# Patient Record
Sex: Female | Born: 1955 | Race: Black or African American | Hispanic: No | Smoking: Never smoker
Health system: Southern US, Community
[De-identification: ages and names within clinical notes are randomized; demographics above are authoritative.]

## PROBLEM LIST (undated history)

## (undated) DIAGNOSIS — Z9889 Other specified postprocedural states: Secondary | ICD-10-CM

## (undated) DIAGNOSIS — F419 Anxiety disorder, unspecified: Secondary | ICD-10-CM

## (undated) DIAGNOSIS — K219 Gastro-esophageal reflux disease without esophagitis: Secondary | ICD-10-CM

## (undated) DIAGNOSIS — R112 Nausea with vomiting, unspecified: Secondary | ICD-10-CM

## (undated) DIAGNOSIS — I10 Essential (primary) hypertension: Secondary | ICD-10-CM

## (undated) DIAGNOSIS — M1990 Unspecified osteoarthritis, unspecified site: Secondary | ICD-10-CM

## (undated) HISTORY — PX: HAND SURGERY: SHX662

## (undated) HISTORY — PX: ABDOMINAL HYSTERECTOMY: SHX81

## (undated) HISTORY — PX: CARPAL TUNNEL RELEASE: SHX101

---

## 1998-02-25 ENCOUNTER — Encounter: Payer: Self-pay | Admitting: Emergency Medicine

## 1998-02-25 ENCOUNTER — Emergency Department (HOSPITAL_COMMUNITY): Admission: EM | Admit: 1998-02-25 | Discharge: 1998-02-25 | Payer: Self-pay | Admitting: Emergency Medicine

## 1998-05-14 ENCOUNTER — Encounter: Payer: Self-pay | Admitting: Emergency Medicine

## 1998-05-14 ENCOUNTER — Emergency Department (HOSPITAL_COMMUNITY): Admission: EM | Admit: 1998-05-14 | Discharge: 1998-05-14 | Payer: Self-pay | Admitting: *Deleted

## 1998-05-25 ENCOUNTER — Ambulatory Visit (HOSPITAL_COMMUNITY): Admission: RE | Admit: 1998-05-25 | Discharge: 1998-05-25 | Payer: Self-pay | Admitting: Family Medicine

## 1998-08-06 ENCOUNTER — Ambulatory Visit (HOSPITAL_COMMUNITY): Admission: RE | Admit: 1998-08-06 | Discharge: 1998-08-06 | Payer: Self-pay | Admitting: Family Medicine

## 1998-12-18 ENCOUNTER — Ambulatory Visit (HOSPITAL_BASED_OUTPATIENT_CLINIC_OR_DEPARTMENT_OTHER): Admission: RE | Admit: 1998-12-18 | Discharge: 1998-12-18 | Payer: Self-pay | Admitting: Orthopedic Surgery

## 1999-03-02 ENCOUNTER — Encounter: Admission: RE | Admit: 1999-03-02 | Discharge: 1999-05-31 | Payer: Self-pay | Admitting: Family Medicine

## 1999-04-14 ENCOUNTER — Ambulatory Visit (HOSPITAL_BASED_OUTPATIENT_CLINIC_OR_DEPARTMENT_OTHER): Admission: RE | Admit: 1999-04-14 | Discharge: 1999-04-14 | Payer: Self-pay | Admitting: Orthopedic Surgery

## 1999-11-03 ENCOUNTER — Encounter (INDEPENDENT_AMBULATORY_CARE_PROVIDER_SITE_OTHER): Payer: Self-pay | Admitting: *Deleted

## 1999-11-03 ENCOUNTER — Ambulatory Visit (HOSPITAL_BASED_OUTPATIENT_CLINIC_OR_DEPARTMENT_OTHER): Admission: RE | Admit: 1999-11-03 | Discharge: 1999-11-03 | Payer: Self-pay | Admitting: Orthopedic Surgery

## 2001-09-07 ENCOUNTER — Other Ambulatory Visit: Admission: RE | Admit: 2001-09-07 | Discharge: 2001-09-07 | Payer: Self-pay | Admitting: Family Medicine

## 2002-07-04 ENCOUNTER — Encounter: Payer: Self-pay | Admitting: Emergency Medicine

## 2002-07-04 ENCOUNTER — Emergency Department (HOSPITAL_COMMUNITY): Admission: EM | Admit: 2002-07-04 | Discharge: 2002-07-04 | Payer: Self-pay | Admitting: *Deleted

## 2003-02-16 ENCOUNTER — Encounter: Payer: Self-pay | Admitting: Emergency Medicine

## 2003-02-16 ENCOUNTER — Emergency Department (HOSPITAL_COMMUNITY): Admission: EM | Admit: 2003-02-16 | Discharge: 2003-02-16 | Payer: Self-pay | Admitting: Emergency Medicine

## 2003-08-04 ENCOUNTER — Emergency Department (HOSPITAL_COMMUNITY): Admission: EM | Admit: 2003-08-04 | Discharge: 2003-08-04 | Payer: Self-pay | Admitting: Emergency Medicine

## 2003-08-05 ENCOUNTER — Emergency Department (HOSPITAL_COMMUNITY): Admission: EM | Admit: 2003-08-05 | Discharge: 2003-08-05 | Payer: Self-pay | Admitting: Family Medicine

## 2004-06-24 ENCOUNTER — Emergency Department (HOSPITAL_COMMUNITY): Admission: EM | Admit: 2004-06-24 | Discharge: 2004-06-24 | Payer: Self-pay | Admitting: Family Medicine

## 2004-08-02 ENCOUNTER — Emergency Department (HOSPITAL_COMMUNITY): Admission: EM | Admit: 2004-08-02 | Discharge: 2004-08-02 | Payer: Self-pay | Admitting: Family Medicine

## 2005-05-09 ENCOUNTER — Emergency Department (HOSPITAL_COMMUNITY): Admission: EM | Admit: 2005-05-09 | Discharge: 2005-05-09 | Payer: Self-pay | Admitting: Family Medicine

## 2005-06-21 ENCOUNTER — Emergency Department (HOSPITAL_COMMUNITY): Admission: EM | Admit: 2005-06-21 | Discharge: 2005-06-21 | Payer: Self-pay | Admitting: Emergency Medicine

## 2005-06-24 ENCOUNTER — Emergency Department (HOSPITAL_COMMUNITY): Admission: EM | Admit: 2005-06-24 | Discharge: 2005-06-24 | Payer: Self-pay | Admitting: Family Medicine

## 2005-06-29 ENCOUNTER — Emergency Department (HOSPITAL_COMMUNITY): Admission: EM | Admit: 2005-06-29 | Discharge: 2005-06-29 | Payer: Self-pay | Admitting: Emergency Medicine

## 2005-07-01 ENCOUNTER — Emergency Department (HOSPITAL_COMMUNITY): Admission: EM | Admit: 2005-07-01 | Discharge: 2005-07-01 | Payer: Self-pay | Admitting: Emergency Medicine

## 2005-07-15 ENCOUNTER — Emergency Department (HOSPITAL_COMMUNITY): Admission: EM | Admit: 2005-07-15 | Discharge: 2005-07-15 | Payer: Self-pay | Admitting: Family Medicine

## 2005-11-20 ENCOUNTER — Emergency Department (HOSPITAL_COMMUNITY): Admission: EM | Admit: 2005-11-20 | Discharge: 2005-11-20 | Payer: Self-pay | Admitting: Emergency Medicine

## 2005-11-25 ENCOUNTER — Encounter: Admission: RE | Admit: 2005-11-25 | Discharge: 2005-11-25 | Payer: Self-pay | Admitting: Internal Medicine

## 2006-01-16 ENCOUNTER — Encounter: Admission: RE | Admit: 2006-01-16 | Discharge: 2006-01-16 | Payer: Self-pay | Admitting: Internal Medicine

## 2006-02-19 ENCOUNTER — Emergency Department (HOSPITAL_COMMUNITY): Admission: EM | Admit: 2006-02-19 | Discharge: 2006-02-19 | Payer: Self-pay | Admitting: Emergency Medicine

## 2006-03-12 ENCOUNTER — Emergency Department (HOSPITAL_COMMUNITY): Admission: EM | Admit: 2006-03-12 | Discharge: 2006-03-12 | Payer: Self-pay | Admitting: Emergency Medicine

## 2006-04-12 ENCOUNTER — Encounter: Admission: RE | Admit: 2006-04-12 | Discharge: 2006-04-12 | Payer: Self-pay | Admitting: Internal Medicine

## 2006-11-16 ENCOUNTER — Emergency Department (HOSPITAL_COMMUNITY): Admission: EM | Admit: 2006-11-16 | Discharge: 2006-11-16 | Payer: Self-pay | Admitting: Family Medicine

## 2006-11-28 ENCOUNTER — Encounter: Admission: RE | Admit: 2006-11-28 | Discharge: 2006-11-28 | Payer: Self-pay | Admitting: Internal Medicine

## 2007-01-18 ENCOUNTER — Emergency Department (HOSPITAL_COMMUNITY): Admission: EM | Admit: 2007-01-18 | Discharge: 2007-01-18 | Payer: Self-pay | Admitting: Emergency Medicine

## 2007-01-21 ENCOUNTER — Emergency Department (HOSPITAL_COMMUNITY): Admission: EM | Admit: 2007-01-21 | Discharge: 2007-01-21 | Payer: Self-pay | Admitting: Emergency Medicine

## 2007-04-09 ENCOUNTER — Emergency Department (HOSPITAL_COMMUNITY): Admission: EM | Admit: 2007-04-09 | Discharge: 2007-04-09 | Payer: Self-pay | Admitting: Emergency Medicine

## 2007-04-15 ENCOUNTER — Emergency Department (HOSPITAL_COMMUNITY): Admission: EM | Admit: 2007-04-15 | Discharge: 2007-04-15 | Payer: Self-pay | Admitting: Family Medicine

## 2007-05-20 ENCOUNTER — Emergency Department (HOSPITAL_COMMUNITY): Admission: EM | Admit: 2007-05-20 | Discharge: 2007-05-20 | Payer: Self-pay | Admitting: Emergency Medicine

## 2007-06-14 ENCOUNTER — Emergency Department (HOSPITAL_COMMUNITY): Admission: EM | Admit: 2007-06-14 | Discharge: 2007-06-14 | Payer: Self-pay | Admitting: Emergency Medicine

## 2007-07-16 ENCOUNTER — Emergency Department (HOSPITAL_COMMUNITY): Admission: EM | Admit: 2007-07-16 | Discharge: 2007-07-16 | Payer: Self-pay | Admitting: Family Medicine

## 2007-11-21 ENCOUNTER — Emergency Department (HOSPITAL_COMMUNITY): Admission: EM | Admit: 2007-11-21 | Discharge: 2007-11-21 | Payer: Self-pay | Admitting: Family Medicine

## 2007-11-30 ENCOUNTER — Encounter: Admission: RE | Admit: 2007-11-30 | Discharge: 2007-11-30 | Payer: Self-pay | Admitting: Internal Medicine

## 2008-06-18 ENCOUNTER — Emergency Department (HOSPITAL_COMMUNITY): Admission: EM | Admit: 2008-06-18 | Discharge: 2008-06-18 | Payer: Self-pay | Admitting: Family Medicine

## 2008-06-30 ENCOUNTER — Emergency Department (HOSPITAL_COMMUNITY): Admission: EM | Admit: 2008-06-30 | Discharge: 2008-06-30 | Payer: Self-pay | Admitting: Family Medicine

## 2008-07-07 ENCOUNTER — Encounter: Admission: RE | Admit: 2008-07-07 | Discharge: 2008-07-07 | Payer: Self-pay | Admitting: Internal Medicine

## 2008-12-02 ENCOUNTER — Encounter: Admission: RE | Admit: 2008-12-02 | Discharge: 2008-12-02 | Payer: Self-pay | Admitting: Internal Medicine

## 2009-02-20 ENCOUNTER — Emergency Department (HOSPITAL_COMMUNITY): Admission: EM | Admit: 2009-02-20 | Discharge: 2009-02-20 | Payer: Self-pay | Admitting: Emergency Medicine

## 2009-09-24 ENCOUNTER — Emergency Department (HOSPITAL_COMMUNITY): Admission: EM | Admit: 2009-09-24 | Discharge: 2009-09-24 | Payer: Self-pay | Admitting: Emergency Medicine

## 2009-12-07 ENCOUNTER — Encounter: Admission: RE | Admit: 2009-12-07 | Discharge: 2009-12-07 | Payer: Self-pay | Admitting: Internal Medicine

## 2010-02-11 ENCOUNTER — Emergency Department (HOSPITAL_COMMUNITY): Admission: EM | Admit: 2010-02-11 | Discharge: 2010-02-11 | Payer: Self-pay | Admitting: Emergency Medicine

## 2010-02-11 ENCOUNTER — Encounter (INDEPENDENT_AMBULATORY_CARE_PROVIDER_SITE_OTHER): Payer: Self-pay | Admitting: Emergency Medicine

## 2010-02-11 ENCOUNTER — Ambulatory Visit: Payer: Self-pay | Admitting: Surgery

## 2010-04-09 ENCOUNTER — Emergency Department (HOSPITAL_COMMUNITY): Admission: EM | Admit: 2010-04-09 | Discharge: 2010-04-09 | Payer: Self-pay | Admitting: Family Medicine

## 2010-07-10 ENCOUNTER — Emergency Department (HOSPITAL_COMMUNITY)
Admission: EM | Admit: 2010-07-10 | Discharge: 2010-07-10 | Payer: Self-pay | Source: Home / Self Care | Admitting: Family Medicine

## 2010-10-09 LAB — URINALYSIS, ROUTINE W REFLEX MICROSCOPIC
Glucose, UA: NEGATIVE mg/dL
Ketones, ur: NEGATIVE mg/dL
Nitrite: NEGATIVE
Protein, ur: NEGATIVE mg/dL
Specific Gravity, Urine: 1.007 (ref 1.005–1.030)
pH: 6 (ref 5.0–8.0)

## 2010-10-09 LAB — WET PREP, GENITAL

## 2010-10-09 LAB — URINE MICROSCOPIC-ADD ON

## 2010-10-09 LAB — GC/CHLAMYDIA PROBE AMP, GENITAL
Chlamydia, DNA Probe: NEGATIVE
GC Probe Amp, Genital: NEGATIVE

## 2010-11-01 ENCOUNTER — Other Ambulatory Visit: Payer: Self-pay | Admitting: Internal Medicine

## 2010-11-01 DIAGNOSIS — Z1231 Encounter for screening mammogram for malignant neoplasm of breast: Secondary | ICD-10-CM

## 2010-11-19 NOTE — Op Note (Signed)
[place]  Patient:    Sarah Webster, Sarah Webster                           MRN: [id number] Proc. Date: 11/03/99 Adm. Date:  [id number] Attending:  Marlowe Sh[place] CC:         Artist Pais Mina Marble, M.D., (2 copies) [address] [facility]                           Operative Report  PREOPERATIVE DIAGNOSIS:  Recurrent carpal tunnel syndrome, left hand and wrist.  POSTOPERATIVE DIAGNOSIS:  Recurrent carpal tunnel syndrome, left hand and wrist.  OPERATION:  Exploration medial nerve, left wrist, with neurolysis as well as hypothenar fat pad flap coverage of median nerve.  SURGEON:  Artist Pais. Mina Marble, M.D.  ASSISTANT:  Nicki Reaper, M.D.  ANESTHESIA:  General.  TOURNIQUET TIME:  1 hour.  COMPLICATIONS:  None.  DRAINS:  None.  DESCRIPTION OF PROCEDURE:  The patient was taken to the operating room, and after the induction of adequate general anesthesia, the left upper extremity was prepped and draped in the usual sterile fashion.  An Esmarch was used to exsanguinate the limb, and tourniquet was inflated to 250 mmHg.  At this point in time, using a previous incision for her carpal tunnel relief done in the past, a standard approach to the median nerve at the wrist was undertaken. The skin was incised, and the flaps were elevated in both the radial and ulnar direction.  Once this was done in the proximal most aspect of the incision, the median nerve was identified in normal tissue.  At this point in time, the median nerve was traced from proximal to distal through the previous operative site.  There was significant scarring noted throughout the entire length of the operative field from an area approximately 3 to 4 cm proximal to the wrist crease distal to the edge of the transverse carpal ligament.  Using a combination of #15 blade, tenotomy scissors, and blunt dissection, the median nerve was carefully dissected free from a significant amount of  underlying scar and cicatrix.  A neurolysis was performed under loupe magnification throughout the entire length of the exposed nerve.  The palmar cutaneous branch was carefully identified and retracted radially to protect it during this dissection.  After the nerve had been thoroughly isolated, decision was then made to proceed with a hypothenar fat pad flap.  Careful dissection of the hypothenar fat was undertaken starting distally in the area of Guyons canal and going proximally into the distal forearm area.  The flap was elevated carefully with #15 blade and tenotomy scissors with some attention paid to the confluence between the ulnar flap, the transverse carpal ligament, and the undersurface of the flap.  This was carefully dissected free of the ulnar neurovascular bundle.  Once this was done, using 3-0 Mersilene, the contents of the carpal canal was retracted ulnarly, and the 3-0 Mersilene stitch was placed deep in the carpal canal, and then the hypothenar fat pad flap was advanced over the contents of the carpal canal including the median nerve.  There was still some exposed median nerve proximally, and at this point in time, a fair amount of fat that had been harvested from the ulnar side of the distal forearm on a pedicle was then sutured over the median nerve to the radial side of the incision using 3-0  Vicryl.  The entire median was then covered throughout the length of the incision.  The wound was then thoroughly irrigated and closed closely with a 4-0 nylon stitch in interrupted horizontal mattress sutures.  A sterile dressing of Xeroform, 4x4 fluffs, and compressive wrap were applied as well as a volar splint.  The patient tolerated the procedure well and went to the recovery room in stable fashion. DD:  11/03/99 TD:  11/04/99 Job: [id number]

## 2010-12-09 ENCOUNTER — Ambulatory Visit
Admission: RE | Admit: 2010-12-09 | Discharge: 2010-12-09 | Disposition: A | Discharge status: Home / Self Care | Payer: 59 | Source: Ambulatory Visit | Attending: Internal Medicine | Admitting: Internal Medicine

## 2010-12-09 DIAGNOSIS — Z1231 Encounter for screening mammogram for malignant neoplasm of breast: Secondary | ICD-10-CM

## 2011-04-18 LAB — POCT URINALYSIS DIP (DEVICE)
Hgb urine dipstick: NEGATIVE
Operator id: 239701
Specific Gravity, Urine: 1.01
Urobilinogen, UA: 0.2

## 2011-09-19 ENCOUNTER — Encounter (HOSPITAL_COMMUNITY): Payer: Self-pay | Admitting: Emergency Medicine

## 2011-09-19 ENCOUNTER — Emergency Department (INDEPENDENT_AMBULATORY_CARE_PROVIDER_SITE_OTHER)
Admission: EM | Admit: 2011-09-19 | Discharge: 2011-09-19 | Disposition: A | Discharge status: Home / Self Care | Payer: 59 | Source: Home / Self Care

## 2011-09-19 DIAGNOSIS — M545 Low back pain, unspecified: Secondary | ICD-10-CM

## 2011-09-19 HISTORY — DX: Essential (primary) hypertension: I10

## 2011-09-19 HISTORY — DX: Unspecified osteoarthritis, unspecified site: M19.90

## 2011-09-19 MED ORDER — CYCLOBENZAPRINE HCL 10 MG PO TABS: 10.0000 mg | ORAL_TABLET | Freq: Three times a day (TID) | ORAL | Status: AC | PRN

## 2011-09-19 MED ORDER — OXYCODONE-ACETAMINOPHEN 5-325 MG PO TABS: 1.0000 | ORAL_TABLET | ORAL | Status: AC | PRN

## 2011-09-19 NOTE — Discharge Instructions (Signed)
Thank you for coming in today. Please use the flexeril and percocet as needed.  Follow up with your doctor if you do not improve.  Call your doctor or go to the emergency room if you have new weakness, numbness , worsening pain or problems pooping or peeing.   Back Pain, Adult Back pain is very common. The pain often gets better over time. The cause of back pain is usually not dangerous. Most people can learn to manage their back pain on their own.  HOME CARE   Stay active. Start with short walks on flat ground if you can. Try to walk farther each day.   Do not sit, drive, or stand in one place for more than 30 minutes. Do not stay in bed.   Do not avoid exercise or work. Activity can help your back heal faster.   Be careful when you bend or lift an object. Bend at your knees, keep the object close to you, and do not twist.   Sleep on a firm mattress. Lie on your side, and bend your knees. If you lie on your back, put a pillow under your knees.   Only take medicines as told by your doctor.   Put ice on the injured area.   Put ice in a plastic bag.   Place a towel between your skin and the bag.   Leave the ice on for 15 to 20 minutes, 3 to 4 times a day for the first 2 to 3 days. After that, you can switch between ice and heat packs.   Ask your doctor about back exercises or massage.   Avoid feeling anxious or stressed. Find good ways to deal with stress, such as exercise.  GET HELP RIGHT AWAY IF:   Your pain does not go away with rest or medicine.   Your pain does not go away in 1 week.   You have new problems.   You do not feel well.   The pain spreads into your legs.   You cannot control when you poop (bowel movement) or pee (urinate).   Your arms or legs feel weak or lose feeling (numbness).   You feel sick to your stomach (nauseous) or throw up (vomit).   You have belly (abdominal) pain.   You feel like you may pass out (faint).  MAKE SURE YOU:   Understand  these instructions.   Will watch your condition.   Will get help right away if you are not doing well or get worse.  Document Released: 12/07/2007 Document Revised: 06/09/2011 Document Reviewed: 11/08/2010 [facility] Patient Information 2012 [place], [place].

## 2011-09-19 NOTE — ED Notes (Signed)
Pt having back pain since last Friday and has had an "accident in 99". Pt states pain is in legs as well, but that is normal for her.

## 2011-09-19 NOTE — ED Provider Notes (Signed)
Sarah Webster is a 56 y.o. female who presents to Urgent Care today for back pain since Friday. Patient has chronic back pain and is managed by her primary care doctor Dr. Renae Gloss with 60 tablets of Vicodin a month.  Friday she noted worsening bilateral lumbar back pain without radiation. She's tried ibuprofen and hydrocodone which have not helped much. She denies any radiation radiculopathy new weakness numbness or bowel or bladder dysfunction. Otherwise she feels well   PMH reviewed. Significant for chronic lumbar back pain and diabetes ROS as above otherwise neg Medications reviewed. No current facility-administered medications for this encounter.   Current Outpatient Prescriptions  Medication Sig Dispense Refill  . ALPRAZolam (XANAX) 0.25 MG tablet Take 0.25 mg by mouth 2 (two) times daily.      Marland Kitchen aspirin 81 MG tablet Take 81 mg by mouth daily.      Marland Kitchen HYDROcodone-acetaminophen (NORCO) 5-325 MG per tablet Take 1 tablet by mouth 2 (two) times daily.      Marland Kitchen ibuprofen (ADVIL,MOTRIN) 800 MG tablet Take 800 mg by mouth every 8 (eight) hours as needed.      . metFORMIN (GLUCOPHAGE) 1000 MG tablet Take 1,000 mg by mouth 2 (two) times daily with a meal.      . cyclobenzaprine (FLEXERIL) 10 MG tablet Take 1 tablet (10 mg total) by mouth 3 (three) times daily as needed for muscle spasms.  30 tablet  0  . oxyCODONE-acetaminophen (ROXICET) 5-325 MG per tablet Take 1 tablet by mouth every 4 (four) hours as needed for pain.  15 tablet  0  (controlled substance database query performed which confirms patient's medication history)  Exam:  BP 126/68  Pulse 85  Temp(Src) 98.3 F (36.8 C) (Oral)  Resp 18  SpO2 100% Gen: Well NAD Lungs: CTABL Nl WOB Heart: RRR no MRG Abd: NABS, NT, ND Exts: Non edematous BL  LE, warm and well perfused.  MSK: Nontender over spinal midline. Bilateral SI joint pain.  Straight leg raise test and Pearlean Brownie tests are normal bilateral.  Strength is preserved throughout lower  extremities as is sensation. Reflexes are 1+ and equal bilaterally. Patient can get off of and onto exam table by herself.    Assessment and Plan: 56 year old woman with acute back pain on chronic back pain.  She has no red flag signs or symptoms today.  This is likely muscular back pain.  As her pain is resistant to Vicodin will use 15 tablets of Percocet and Flexeril. Discussed warning signs and asked patient to followup if not improving. Handout provided. Patient expresses understanding.     Rodolph Bong, MD 09/19/11 [phone]

## 2011-09-20 NOTE — ED Provider Notes (Signed)
Medical screening examination/treatment/procedure(s) were performed by a resident physician and as supervising physician I was immediately available for consultation/collaboration.  Leslee Home, M.D.   Reuben Likes, MD 09/20/11 [phone]

## 2011-10-23 ENCOUNTER — Emergency Department (HOSPITAL_COMMUNITY)
Admission: EM | Admit: 2011-10-23 | Discharge: 2011-10-23 | Disposition: A | Discharge status: Home / Self Care | Payer: Medicare Other | Attending: Emergency Medicine | Admitting: Emergency Medicine

## 2011-10-23 ENCOUNTER — Emergency Department (HOSPITAL_COMMUNITY): Payer: Medicare Other

## 2011-10-23 ENCOUNTER — Encounter (HOSPITAL_COMMUNITY): Payer: Self-pay | Admitting: Emergency Medicine

## 2011-10-23 DIAGNOSIS — Z79899 Other long term (current) drug therapy: Secondary | ICD-10-CM | POA: Insufficient documentation

## 2011-10-23 DIAGNOSIS — R070 Pain in throat: Secondary | ICD-10-CM | POA: Insufficient documentation

## 2011-10-23 DIAGNOSIS — J3489 Other specified disorders of nose and nasal sinuses: Secondary | ICD-10-CM | POA: Insufficient documentation

## 2011-10-23 DIAGNOSIS — R499 Unspecified voice and resonance disorder: Secondary | ICD-10-CM | POA: Insufficient documentation

## 2011-10-23 DIAGNOSIS — R05 Cough: Secondary | ICD-10-CM | POA: Insufficient documentation

## 2011-10-23 DIAGNOSIS — R220 Localized swelling, mass and lump, head: Secondary | ICD-10-CM | POA: Insufficient documentation

## 2011-10-23 DIAGNOSIS — I10 Essential (primary) hypertension: Secondary | ICD-10-CM | POA: Insufficient documentation

## 2011-10-23 DIAGNOSIS — M129 Arthropathy, unspecified: Secondary | ICD-10-CM | POA: Insufficient documentation

## 2011-10-23 DIAGNOSIS — Z9889 Other specified postprocedural states: Secondary | ICD-10-CM | POA: Insufficient documentation

## 2011-10-23 DIAGNOSIS — J329 Chronic sinusitis, unspecified: Secondary | ICD-10-CM

## 2011-10-23 DIAGNOSIS — R059 Cough, unspecified: Secondary | ICD-10-CM | POA: Insufficient documentation

## 2011-10-23 DIAGNOSIS — E119 Type 2 diabetes mellitus without complications: Secondary | ICD-10-CM | POA: Insufficient documentation

## 2011-10-23 MED ORDER — AZITHROMYCIN 250 MG PO TABS
500.0000 mg | ORAL_TABLET | Freq: Once | ORAL | Status: AC
  Administered 2011-10-23: 500 mg via ORAL
  Filled 2011-10-23: qty 2

## 2011-10-23 MED ORDER — OXYMETAZOLINE HCL 0.05 % NA SOLN
1.0000 | Freq: Once | NASAL | Status: AC
  Administered 2011-10-23: 1 via NASAL
  Filled 2011-10-23: qty 15

## 2011-10-23 MED ORDER — AZITHROMYCIN 250 MG PO TABS: ORAL_TABLET | ORAL | Status: AC

## 2011-10-23 NOTE — ED Notes (Signed)
Patient transported to X-ray 

## 2011-10-23 NOTE — ED Provider Notes (Signed)
History     CSN: [id number]  Arrival date & time 10/23/11  1648   First MD Initiated Contact with Patient 10/23/11 1728      Chief Complaint  Patient presents with  . Cough    (Consider location/radiation/quality/duration/timing/severity/associated sxs/prior treatment) HPI Comments: Nasal congestion and sinus pressure worsening now for 2 weeks.  Starting to cause a frontal headache.  Patient is a 56 y.o. female presenting with cough. The history is provided by the patient.  Cough This is a new problem. Episode onset: 2 weeks. The problem occurs every few minutes. The problem has not changed since onset.The cough is productive of sputum. There has been no fever. Associated symptoms include ear congestion, rhinorrhea and sore throat. Pertinent negatives include no chills, no sweats, no ear pain, no shortness of breath and no wheezing. She has tried decongestants for the symptoms. The treatment provided no relief. She is not a smoker.    Past Medical History  Diagnosis Date  . Arthritis   . Diabetes mellitus   . Hypertension     Past Surgical History  Procedure Date  . Hand surgery     No family history on file.  History  Substance Use Topics  . Smoking status: Not on file  . Smokeless tobacco: Not on file  . Alcohol Use: No    OB History    Grav Para Term Preterm Abortions TAB SAB Ect Mult Living                  Review of Systems  Constitutional: Negative for chills.  HENT: Positive for nosebleeds, congestion, sore throat, rhinorrhea, voice change and sinus pressure. Negative for ear pain, sneezing, trouble swallowing, neck pain and ear discharge.   Respiratory: Positive for cough. Negative for shortness of breath and wheezing.   All other systems reviewed and are negative.    Allergies  Levaquin; Ciprofloxacin; Contrast media; Ponstel; and Sulfa antibiotics  Home Medications   Current Outpatient Rx  Name Route Sig Dispense Refill  . ALPRAZOLAM 0.25 MG  PO TABS Oral Take 0.25 mg by mouth 2 (two) times daily.    . ASPIRIN 81 MG PO TABS Oral Take 81 mg by mouth daily.    Marland Kitchen EZETIMIBE-SIMVASTATIN 10-20 MG PO TABS Oral Take 1 tablet by mouth every morning.    Marland Kitchen HYDROCODONE-ACETAMINOPHEN 5-325 MG PO TABS Oral Take 1 tablet by mouth every 6 (six) hours as needed. For pain    . IBUPROFEN 800 MG PO TABS Oral Take 800 mg by mouth every 8 (eight) hours as needed. For pain    . METFORMIN HCL 1000 MG PO TABS Oral Take 1,000 mg by mouth 2 (two) times daily with a meal.    . SITAGLIPTIN PHOSPHATE 100 MG PO TABS Oral Take 100 mg by mouth daily.    Marland Kitchen VALSARTAN 160 MG PO TABS Oral Take 160 mg by mouth daily.      BP 112/96  Pulse 108  Temp(Src) 98.4 F (36.9 C) (Oral)  Resp 20  SpO2 99%  Physical Exam  Nursing note and vitals reviewed. Constitutional: She is oriented to person, place, and time. She appears well-developed and well-nourished. No distress.  HENT:  Head: Normocephalic and atraumatic.  Right Ear: Tympanic membrane and ear canal normal.  Left Ear: Tympanic membrane and ear canal normal.  Nose: Mucosal edema and rhinorrhea present. No epistaxis. Right sinus exhibits maxillary sinus tenderness and frontal sinus tenderness. Left sinus exhibits maxillary sinus tenderness and frontal sinus  tenderness.  Mouth/Throat: Mucous membranes are normal. Posterior oropharyngeal erythema present. No posterior oropharyngeal edema or tonsillar abscesses.  Eyes: EOM are normal. Pupils are equal, round, and reactive to light.  Cardiovascular: Normal rate, regular rhythm, normal heart sounds and intact distal pulses.  Exam reveals no friction rub.   No murmur heard. Pulmonary/Chest: Effort normal and breath sounds normal. She has no wheezes. She has no rales.  Musculoskeletal: Normal range of motion. She exhibits no tenderness.       No edema  Neurological: She is alert and oriented to person, place, and time. No cranial nerve deficit.  Skin: Skin is warm and  dry. No rash noted.  Psychiatric: She has a normal mood and affect. Her behavior is normal.    ED Course  Procedures (including critical care time)  Labs Reviewed - No data to display Dg Chest 2 View  10/23/2011  *RADIOLOGY REPORT*  Clinical Data: Cough  CHEST - 2 VIEW  Comparison: 04/12/2006  Findings: Cardiomediastinal silhouette is within normal limits. The lungs are clear. No pleural effusion.  No pneumothorax.  No acute osseous abnormality.  IMPRESSION: Normal chest.  Original Report Authenticated By: Harrel Lemon, M.D.     No diagnosis found.    MDM   Pt with symptoms consistent with sinusitis with symptoms for 2 weeks.  Well appearing here.  No signs of breathing difficulty  No signs of pharyngitis, otitis or abnormal abdominal findings.   CXR wnl.  Pt treated with z-pack and afrin.         Gwyneth Sprout, MD 10/23/11 1743

## 2011-10-23 NOTE — ED Notes (Signed)
C/o productive cough with green sputum, congestion, and headache x 2 weeks.  States she saw MD and was told to take Claritin.  States she is unable to take it because she has reactions to a lot of medicine.

## 2011-10-23 NOTE — Discharge Instructions (Signed)

## 2011-10-31 ENCOUNTER — Other Ambulatory Visit: Payer: Self-pay | Admitting: Internal Medicine

## 2011-10-31 DIAGNOSIS — Z1231 Encounter for screening mammogram for malignant neoplasm of breast: Secondary | ICD-10-CM

## 2011-11-04 ENCOUNTER — Encounter (HOSPITAL_COMMUNITY): Payer: Self-pay | Admitting: *Deleted

## 2011-11-04 ENCOUNTER — Emergency Department (INDEPENDENT_AMBULATORY_CARE_PROVIDER_SITE_OTHER)
Admission: EM | Admit: 2011-11-04 | Discharge: 2011-11-04 | Disposition: A | Discharge status: Home / Self Care | Payer: Medicare Other | Source: Home / Self Care | Attending: Emergency Medicine | Admitting: Emergency Medicine

## 2011-11-04 DIAGNOSIS — R05 Cough: Secondary | ICD-10-CM

## 2011-11-04 DIAGNOSIS — N760 Acute vaginitis: Secondary | ICD-10-CM

## 2011-11-04 DIAGNOSIS — R059 Cough, unspecified: Secondary | ICD-10-CM

## 2011-11-04 LAB — POCT URINALYSIS DIP (DEVICE)
Nitrite: NEGATIVE
Protein, ur: NEGATIVE mg/dL
Specific Gravity, Urine: 1.01 (ref 1.005–1.030)
Urobilinogen, UA: 0.2 mg/dL (ref 0.0–1.0)
pH: 5.5 (ref 5.0–8.0)

## 2011-11-04 LAB — WET PREP, GENITAL

## 2011-11-04 MED ORDER — FLUCONAZOLE 150 MG PO TABS: 150.0000 mg | ORAL_TABLET | Freq: Once | ORAL | Status: AC

## 2011-11-04 MED ORDER — METRONIDAZOLE 500 MG PO TABS: 500.0000 mg | ORAL_TABLET | Freq: Two times a day (BID) | ORAL | Status: AC

## 2011-11-04 MED ORDER — PREDNISONE 20 MG PO TABS: 40.0000 mg | ORAL_TABLET | Freq: Every day | ORAL | Status: AC

## 2011-11-04 NOTE — ED Provider Notes (Signed)
History     CSN: [id number]  Arrival date & time 11/04/11  7829   First MD Initiated Contact with Patient 11/04/11 1012      Chief Complaint  Patient presents with  . Vaginitis  . Cough    (Consider location/radiation/quality/duration/timing/severity/associated sxs/prior treatment) HPI Comments: Patient presents to urgent care complaining of ongoing vaginal discharge with marked itchiness on her external genitalia that has been occurring for approximately a month. Patient denies any urinary symptoms, pelvic pain, vaginal bleeding, fevers, dysuria or flank pain.    Patient also described as a couple weeks ago she was seen by her doctor was prescribed antibiotics for respiratory infection she didn't recall the name of it. She was having a productive cough and upper congestion symptoms. Today she describes that she has this ongoing dry cough that it still lingering. Patient denies any fevers, shortness of breath, or any constitutional symptoms such as malaise, fevers, weight loss, fatigue  Patient is a 56 y.o. female presenting with cough and vaginal discharge. The history is provided by the patient.  Cough This is a new problem. The current episode started more than 1 week ago. The problem occurs constantly. The cough is non-productive. There has been no fever. Pertinent negatives include no chest pain and no chills. She has tried nothing for the symptoms. Her past medical history does not include pneumonia.  Vaginal Discharge This is a new problem. Pertinent negatives include no chest pain. The symptoms are aggravated by nothing. The symptoms are relieved by nothing. She has tried nothing for the symptoms.    Past Medical History  Diagnosis Date  . Arthritis   . Diabetes mellitus   . Hypertension     Past Surgical History  Procedure Date  . Hand surgery     History reviewed. No pertinent family history.  History  Substance Use Topics  . Smoking status: Not on file  .  Smokeless tobacco: Not on file  . Alcohol Use: No    OB History    Grav Para Term Preterm Abortions TAB SAB Ect Mult Living                  Review of Systems  Constitutional: Negative for fever, chills, diaphoresis, activity change, appetite change and fatigue.  Eyes: Negative for discharge.  Respiratory: Positive for cough. Negative for choking and chest tightness.   Cardiovascular: Negative for chest pain.  Genitourinary: Positive for vaginal discharge.  Neurological: Negative for dizziness.    Allergies  Levofloxacin; Ciprofloxacin; Contrast media; Ponstel; and Sulfa antibiotics  Home Medications   Current Outpatient Rx  Name Route Sig Dispense Refill  . ALPRAZOLAM 0.25 MG PO TABS Oral Take 0.25 mg by mouth 2 (two) times daily.    . ASPIRIN 81 MG PO TABS Oral Take 81 mg by mouth daily.    Marland Kitchen EZETIMIBE-SIMVASTATIN 10-20 MG PO TABS Oral Take 1 tablet by mouth every morning.    Marland Kitchen FLUCONAZOLE 150 MG PO TABS Oral Take 1 tablet (150 mg total) by mouth once. repeta treatment in 7 days 2 tablet 0  . HYDROCODONE-ACETAMINOPHEN 5-325 MG PO TABS Oral Take 1 tablet by mouth every 6 (six) hours as needed. For pain    . IBUPROFEN 800 MG PO TABS Oral Take 800 mg by mouth every 8 (eight) hours as needed. For pain    . METFORMIN HCL 1000 MG PO TABS Oral Take 1,000 mg by mouth 2 (two) times daily with a meal.    . METRONIDAZOLE  500 MG PO TABS Oral Take 1 tablet (500 mg total) by mouth 2 (two) times daily. 14 tablet 0  . PREDNISONE 20 MG PO TABS Oral Take 2 tablets (40 mg total) by mouth daily. 2 tablets daily for 5 days 10 tablet 0  . SITAGLIPTIN PHOSPHATE 100 MG PO TABS Oral Take 100 mg by mouth daily.    Marland Kitchen VALSARTAN 160 MG PO TABS Oral Take 160 mg by mouth daily.      BP 126/78  Pulse 82  Temp(Src) 98.2 F (36.8 C) (Oral)  Resp 18  SpO2 100%  Physical Exam  Nursing note and vitals reviewed. Constitutional: She appears well-developed and well-nourished.  Non-toxic appearance. She  does not have a sickly appearance. She does not appear ill. No distress.  HENT:  Head: Normocephalic.  Pulmonary/Chest: Breath sounds normal.  Abdominal: Soft.  Genitourinary: There is erythema around the vagina. No tenderness or bleeding around the vagina. No signs of injury around the vagina. Vaginal discharge found.  Musculoskeletal: Normal range of motion.  Neurological: She is alert. No cranial nerve deficit.  Skin: No rash noted.    ED Course  Procedures (including critical care time)  Labs Reviewed  POCT URINALYSIS DIP (DEVICE) - Abnormal; Notable for the following:    Glucose, UA 500 (*)    Ketones, ur TRACE (*)    Hgb urine dipstick TRACE (*)    All other components within normal limits  GC/CHLAMYDIA PROBE AMP, GENITAL  WET PREP, GENITAL   No results found.   1. Vaginitis and vulvovaginitis   2. Cough       MDM  Patient with 2 distinctive complaints. #1 vaginal discharge with moderate vaginal pruritus. #2 residual dry cough from recently treated respiratory infection. Patient had a pelvic exam was significant for discharge and erythema of external genitalia and #2 patient has a normal respiratory exam no respiratory distress or dyspnea pressure to take prednisone for 5 days and to followup with primary care Dr. cough or still persists beyond 4-6 weeks as an x-ray be helpful in ruling out other alternative sources for cough. Including lung cancer. (Although unlikely)        Jimmie Molly, MD 11/04/11 1141

## 2011-11-04 NOTE — ED Notes (Signed)
Pt is here with complaints vaginal itching x 1 month.  Pt is also complaining of a dry cough.

## 2011-11-04 NOTE — Discharge Instructions (Signed)
We will contact you if further treatment is needed once we obtain the culture results back. Should also followup with her doctor if her cough persists anywhere from 4-6 weeks, further evaluation should happen.

## 2011-11-05 LAB — GC/CHLAMYDIA PROBE AMP, GENITAL: Chlamydia, DNA Probe: NEGATIVE

## 2011-12-12 ENCOUNTER — Ambulatory Visit
Admission: RE | Admit: 2011-12-12 | Discharge: 2011-12-12 | Disposition: A | Discharge status: Home / Self Care | Payer: Medicare Other | Source: Ambulatory Visit | Attending: Internal Medicine | Admitting: Internal Medicine

## 2011-12-12 DIAGNOSIS — Z1231 Encounter for screening mammogram for malignant neoplasm of breast: Secondary | ICD-10-CM

## 2012-02-12 ENCOUNTER — Encounter (HOSPITAL_COMMUNITY): Payer: Self-pay | Admitting: *Deleted

## 2012-02-12 ENCOUNTER — Emergency Department (HOSPITAL_COMMUNITY)
Admission: EM | Admit: 2012-02-12 | Discharge: 2012-02-12 | Disposition: A | Discharge status: Home / Self Care | Payer: Medicare Other | Attending: Emergency Medicine | Admitting: Emergency Medicine

## 2012-02-12 DIAGNOSIS — Z882 Allergy status to sulfonamides status: Secondary | ICD-10-CM | POA: Insufficient documentation

## 2012-02-12 DIAGNOSIS — I10 Essential (primary) hypertension: Secondary | ICD-10-CM | POA: Insufficient documentation

## 2012-02-12 DIAGNOSIS — M129 Arthropathy, unspecified: Secondary | ICD-10-CM | POA: Insufficient documentation

## 2012-02-12 DIAGNOSIS — R51 Headache: Secondary | ICD-10-CM

## 2012-02-12 DIAGNOSIS — E119 Type 2 diabetes mellitus without complications: Secondary | ICD-10-CM | POA: Insufficient documentation

## 2012-02-12 LAB — POCT I-STAT, CHEM 8
Calcium, Ion: 1.23 mmol/L (ref 1.12–1.23)
Chloride: 100 mEq/L (ref 96–112)
HCT: 38 % (ref 36.0–46.0)
Potassium: 3.5 mEq/L (ref 3.5–5.1)
Sodium: 140 mEq/L (ref 135–145)

## 2012-02-12 NOTE — ED Notes (Signed)
Patient currently sitting in waiting room; no respiratory or acute distress.  Updated patient on wait time; apologized about delay.  Patient verbalizes understanding; calm and cooperative; denies any changes in condition.

## 2012-02-12 NOTE — ED Notes (Signed)
Pt ambulated to room with no assistance. Pt sitting up in bed watching tv and talking to grandson. Pt reports she started a new medication (Glimepeirride) on Aug. 1st. Pt reports she has been having a frontal HA X 3 days. Denies sensitivity to light and sounds.

## 2012-02-12 NOTE — ED Provider Notes (Signed)
History     CSN: [id number]  Arrival date & time 02/12/12  1654   First MD Initiated Contact with Patient 02/12/12 2016      Chief Complaint  Patient presents with  . Headache    (Consider location/radiation/quality/duration/timing/severity/associated sxs/prior treatment) Patient is a 56 y.o. female presenting with headaches. The history is provided by the patient.  Headache  This is a new problem. The current episode started more than 2 days ago (gradual onset). The problem occurs constantly. The problem has not changed since onset.The headache is associated with nothing. The pain is located in the frontal region. The quality of the pain is described as throbbing. The pain is at a severity of 5/10. The pain is moderate. The pain does not radiate. Pertinent negatives include no fever, no shortness of breath, no nausea and no vomiting. She has tried nothing for the symptoms.    Past Medical History  Diagnosis Date  . Arthritis   . Diabetes mellitus   . Hypertension     Past Surgical History  Procedure Date  . Hand surgery     No family history on file.  History  Substance Use Topics  . Smoking status: Never Smoker   . Smokeless tobacco: Not on file  . Alcohol Use: No    OB History    Grav Para Term Preterm Abortions TAB SAB Ect Mult Living                  Review of Systems  Constitutional: Negative for fever and chills.  HENT: Negative for neck pain.   Eyes: Negative.  Negative for pain and visual disturbance.  Respiratory: Negative for shortness of breath.   Cardiovascular: Negative for chest pain and leg swelling.  Gastrointestinal: Negative for nausea, vomiting and abdominal pain.  Genitourinary: Negative.   Musculoskeletal: Negative.   Skin: Negative.   Neurological: Positive for headaches. Negative for dizziness, speech difficulty, weakness, light-headedness and numbness.  Hematological: Negative.   Psychiatric/Behavioral: Negative.   All other systems  reviewed and are negative.    Allergies  Levofloxacin; Ciprofloxacin; Contrast media; Ponstel; and Sulfa antibiotics  Home Medications   Current Outpatient Rx  Name Route Sig Dispense Refill  . ALPRAZOLAM 0.25 MG PO TABS Oral Take 0.25 mg by mouth 2 (two) times daily.    . ASPIRIN 81 MG PO TABS Oral Take 81 mg by mouth daily.    Marland Kitchen EZETIMIBE-SIMVASTATIN 10-20 MG PO TABS Oral Take 1 tablet by mouth every morning.    Marland Kitchen GLIMEPIRIDE 2 MG PO TABS Oral Take 2 mg by mouth daily before breakfast.    . HYDROCODONE-ACETAMINOPHEN 5-325 MG PO TABS Oral Take 1 tablet by mouth every 6 (six) hours as needed. For pain    . METFORMIN HCL 1000 MG PO TABS Oral Take 1,000 mg by mouth 2 (two) times daily with a meal.    . SITAGLIPTIN PHOSPHATE 100 MG PO TABS Oral Take 100 mg by mouth daily.    Marland Kitchen VALSARTAN-HYDROCHLOROTHIAZIDE 160-12.5 MG PO TABS Oral Take 1 tablet by mouth daily.      BP 144/80  Pulse 80  Temp 99 F (37.2 C) (Oral)  Resp 18  SpO2 100%  Physical Exam  Nursing note and vitals reviewed. Constitutional: She is oriented to person, place, and time. She appears well-developed and well-nourished. No distress.  HENT:  Head: Normocephalic and atraumatic.  Right Ear: External ear normal.  Left Ear: External ear normal.  Nose: Nose normal.  Mouth/Throat: Oropharynx  is clear and moist.  Eyes: EOM are normal. Pupils are equal, round, and reactive to light.  Neck: Neck supple.  Cardiovascular: Normal rate, regular rhythm, normal heart sounds and intact distal pulses.   Pulmonary/Chest: Effort normal and breath sounds normal. No respiratory distress. She has no wheezes. She has no rales.  Abdominal: Soft. She exhibits no distension. There is no tenderness.  Musculoskeletal: She exhibits no edema.  Lymphadenopathy:    She has no cervical adenopathy.  Neurological: She is alert and oriented to person, place, and time. She has normal strength. No cranial nerve deficit or sensory deficit. She  exhibits normal muscle tone. She displays a negative Romberg sign. Coordination and gait normal. GCS eye subscore is 4. GCS verbal subscore is 5. GCS motor subscore is 6.  Skin: Skin is warm and dry. She is not diaphoretic. No pallor.    ED Course  Procedures (including critical care time)  Labs Reviewed  POCT I-STAT, CHEM 8 - Abnormal; Notable for the following:    Glucose, Bld 161 (*)     All other components within normal limits   No results found.   1. Headache       MDM  56 yo female with 3 days of gradual frontal headache. Neuro exam, including gait, normal. BP 140s. Started shortly after taking amaryl, which is similar to past attempt at using amaryl. Doubt bleed or infection with no neck pain, no acute onset and well appearing patient at this time. Will recommending stopping her amaryl and following up with PCP.         Pricilla Loveless, MD 02/12/12 [phone]

## 2012-02-12 NOTE — ED Notes (Signed)
PT states symptoms started after starting Amaryl for  diabetes

## 2012-02-12 NOTE — ED Notes (Signed)
Pt is here with headache to top of head for 3 days.  No vomiting. No vision changes.  Pt states she felt shakey

## 2012-02-13 NOTE — ED Provider Notes (Signed)
I saw and evaluated the patient, reviewed the resident's note and I agree with the findings and plan.  Gradual onset headache that is frontal. No nausea, vomiting, visual change.  Patient thinks related to glimeride.  No focal deficits.   Glynn Octave, MD 02/13/12 [phone]

## 2012-06-18 ENCOUNTER — Encounter (HOSPITAL_COMMUNITY): Payer: Self-pay | Admitting: *Deleted

## 2012-06-18 ENCOUNTER — Emergency Department (HOSPITAL_COMMUNITY)
Admission: EM | Admit: 2012-06-18 | Discharge: 2012-06-18 | Disposition: A | Discharge status: Home / Self Care | Payer: Medicare Other | Attending: Emergency Medicine | Admitting: Emergency Medicine

## 2012-06-18 DIAGNOSIS — Z7982 Long term (current) use of aspirin: Secondary | ICD-10-CM | POA: Insufficient documentation

## 2012-06-18 DIAGNOSIS — I10 Essential (primary) hypertension: Secondary | ICD-10-CM | POA: Insufficient documentation

## 2012-06-18 DIAGNOSIS — M65849 Other synovitis and tenosynovitis, unspecified hand: Secondary | ICD-10-CM | POA: Insufficient documentation

## 2012-06-18 DIAGNOSIS — E119 Type 2 diabetes mellitus without complications: Secondary | ICD-10-CM | POA: Insufficient documentation

## 2012-06-18 DIAGNOSIS — M129 Arthropathy, unspecified: Secondary | ICD-10-CM | POA: Insufficient documentation

## 2012-06-18 DIAGNOSIS — M65839 Other synovitis and tenosynovitis, unspecified forearm: Secondary | ICD-10-CM | POA: Insufficient documentation

## 2012-06-18 DIAGNOSIS — M778 Other enthesopathies, not elsewhere classified: Secondary | ICD-10-CM

## 2012-06-18 DIAGNOSIS — Z79899 Other long term (current) drug therapy: Secondary | ICD-10-CM | POA: Insufficient documentation

## 2012-06-18 DIAGNOSIS — Z791 Long term (current) use of non-steroidal anti-inflammatories (NSAID): Secondary | ICD-10-CM | POA: Insufficient documentation

## 2012-06-18 MED ORDER — HYDROCODONE-ACETAMINOPHEN 5-325 MG PO TABS: 1.0000 | ORAL_TABLET | Freq: Four times a day (QID) | ORAL | Status: DC | PRN

## 2012-06-18 MED ORDER — HYDROCODONE-ACETAMINOPHEN 5-325 MG PO TABS
2.0000 | ORAL_TABLET | Freq: Once | ORAL | Status: AC
  Administered 2012-06-18: 2 via ORAL
  Filled 2012-06-18: qty 2

## 2012-06-18 NOTE — ED Provider Notes (Signed)
History   This chart was scribed for Doug Sou, MD by Charolett Bumpers, ED Scribe. The patient was seen in room TR08C/TR08C. Patient's care was started at 1314.   CSN: [id number]  Arrival date & time 06/18/12  1138   First MD Initiated Contact with Patient 06/18/12 1314      Chief Complaint  Patient presents with  . Hand Pain    The history is provided by the patient. No language interpreter was used.   Sarah Webster is a 56 y.o. female who presents to the Emergency Department complaining of constant, moderate right hand pain that goes into her wrist that started yesterday around 4 pm after doing laundry. She states she took Ibuprofen with some relief. She denies any known recent injuries. She has a h/o cardiac hx and DM. She has a h/o carpal tunnel surgery on both wrists. She denies any tobacco or alcohol use. She is right handed. Blood sugar was 120 this morning per patient. No fever no injury pain worse with movement improved with remaining still  Past Medical History  Diagnosis Date  . Arthritis   . Diabetes mellitus   . Hypertension     Past Surgical History  Procedure Date  . Hand surgery     No family history on file.  History  Substance Use Topics  . Smoking status: Never Smoker   . Smokeless tobacco: Not on file  . Alcohol Use: No    OB History    Grav Para Term Preterm Abortions TAB SAB Ect Mult Living                  Review of Systems  Constitutional: Negative.   HENT: Negative.   Respiratory: Negative.   Cardiovascular: Negative.   Gastrointestinal: Negative.   Musculoskeletal: Positive for arthralgias.       Right hand and wrist pain.   Skin: Negative.   Neurological: Negative.   Hematological: Negative.   Psychiatric/Behavioral: Negative.     Allergies  Levofloxacin; Ciprofloxacin; Contrast media; Ponstel; and Sulfa antibiotics  Home Medications   Current Outpatient Rx  Name  Route  Sig  Dispense  Refill  . ALPRAZOLAM 0.25 MG  PO TABS   Oral   Take 0.25 mg by mouth 2 (two) times daily.         . ASPIRIN 81 MG PO TABS   Oral   Take 81 mg by mouth daily.         Marland Kitchen EZETIMIBE-SIMVASTATIN 10-20 MG PO TABS   Oral   Take 1 tablet by mouth every morning.         Marland Kitchen GLIMEPIRIDE 2 MG PO TABS   Oral   Take 2 mg by mouth daily before breakfast.         . METFORMIN HCL 1000 MG PO TABS   Oral   Take 1,000 mg by mouth 2 (two) times daily with a meal.         . SITAGLIPTIN PHOSPHATE 100 MG PO TABS   Oral   Take 100 mg by mouth daily.         Marland Kitchen VALSARTAN-HYDROCHLOROTHIAZIDE 160-12.5 MG PO TABS   Oral   Take 1 tablet by mouth daily.         Marland Kitchen HYDROCODONE-ACETAMINOPHEN 5-325 MG PO TABS   Oral   Take 1 tablet by mouth every 6 (six) hours as needed. For pain           BP 112/72  Pulse 68  Temp 98.1 F (36.7 C) (Oral)  Resp 16  SpO2 96%  Physical Exam  Nursing note and vitals reviewed. Constitutional: She appears well-developed and well-nourished.  HENT:  Head: Normocephalic and atraumatic.  Eyes: EOM are normal.  Neck: Neck supple.  Cardiovascular: Normal rate.   Pulmonary/Chest: Effort normal.  Abdominal:       Obese  Musculoskeletal: Normal range of motion. She exhibits tenderness. She exhibits no edema.       Right upper extremity : No redness no warmth, minimal swelling at the dorsum of the wrist Pain with dorsiflexion of right wrist. Pulse 2+ good capillary refill all other extremities without redness swelling or tenderness neurovascularly intact  Neurological: She is alert. Coordination normal.  Skin: Skin is warm and dry. No rash noted.  Psychiatric: She has a normal mood and affect.    ED Course  Procedures (including critical care time)  DIAGNOSTIC STUDIES: Oxygen Saturation is 96% on room air, adequate by my interpretation.    COORDINATION OF CARE:  14:20-Discussed planned course of treatment with the patient including pain medication and placing the wrist in a splint,  who is agreeable at this time. Pt is not driving.     Labs Reviewed - No data to display No results found.   No diagnosis found. 2:20 PM feels much improved after treatment with Vicodin and Velcro splint   MDM  Suspect tendinitis Plan prescription Norco Followup Dr. Renae Gloss if having significant pain one week     Diagnosis right wrist pain  I personally performed the services described in this documentation, which was scribed in my presence. The recorded information has been reviewed and is accurate.    Doug Sou, MD 06/18/12 1426

## 2012-06-18 NOTE — Progress Notes (Signed)
Orthopedic Tech Progress Note Patient Details:  Sarah Webster 19-Aug-1955 [id number] Velcro wrist splint applied to Right wrist Ortho Devices Type of Ortho Device: Velcro wrist splint Ortho Device/Splint Location: Right Ortho Device/Splint Interventions: Application   Asia R Thompson 06/18/2012, 1:34 PM

## 2012-06-18 NOTE — ED Notes (Signed)
Pt is here with right wrist and hand pain that hurts/swollen without injury

## 2012-08-05 ENCOUNTER — Emergency Department (HOSPITAL_COMMUNITY)
Admission: EM | Admit: 2012-08-05 | Discharge: 2012-08-05 | Disposition: A | Discharge status: Home / Self Care | Payer: Medicare Other | Attending: Emergency Medicine | Admitting: Emergency Medicine

## 2012-08-05 ENCOUNTER — Encounter (HOSPITAL_COMMUNITY): Payer: Self-pay | Admitting: Emergency Medicine

## 2012-08-05 DIAGNOSIS — S1093XA Contusion of unspecified part of neck, initial encounter: Secondary | ICD-10-CM | POA: Insufficient documentation

## 2012-08-05 DIAGNOSIS — S0031XA Abrasion of nose, initial encounter: Secondary | ICD-10-CM

## 2012-08-05 DIAGNOSIS — Y929 Unspecified place or not applicable: Secondary | ICD-10-CM | POA: Insufficient documentation

## 2012-08-05 DIAGNOSIS — Z7982 Long term (current) use of aspirin: Secondary | ICD-10-CM | POA: Insufficient documentation

## 2012-08-05 DIAGNOSIS — S0033XA Contusion of nose, initial encounter: Secondary | ICD-10-CM

## 2012-08-05 DIAGNOSIS — I10 Essential (primary) hypertension: Secondary | ICD-10-CM | POA: Insufficient documentation

## 2012-08-05 DIAGNOSIS — IMO0002 Reserved for concepts with insufficient information to code with codable children: Secondary | ICD-10-CM | POA: Insufficient documentation

## 2012-08-05 DIAGNOSIS — S0003XA Contusion of scalp, initial encounter: Secondary | ICD-10-CM | POA: Insufficient documentation

## 2012-08-05 DIAGNOSIS — Z23 Encounter for immunization: Secondary | ICD-10-CM | POA: Insufficient documentation

## 2012-08-05 DIAGNOSIS — Z8739 Personal history of other diseases of the musculoskeletal system and connective tissue: Secondary | ICD-10-CM | POA: Insufficient documentation

## 2012-08-05 DIAGNOSIS — E119 Type 2 diabetes mellitus without complications: Secondary | ICD-10-CM | POA: Insufficient documentation

## 2012-08-05 DIAGNOSIS — Y9389 Activity, other specified: Secondary | ICD-10-CM | POA: Insufficient documentation

## 2012-08-05 DIAGNOSIS — Z79899 Other long term (current) drug therapy: Secondary | ICD-10-CM | POA: Insufficient documentation

## 2012-08-05 MED ORDER — HYDROCODONE-ACETAMINOPHEN 5-325 MG PO TABS
1.0000 | ORAL_TABLET | Freq: Once | ORAL | Status: AC
  Administered 2012-08-05: 1 via ORAL
  Filled 2012-08-05: qty 1

## 2012-08-05 MED ORDER — HYDROCODONE-ACETAMINOPHEN 5-325 MG PO TABS: 1.0000 | ORAL_TABLET | ORAL | Status: DC | PRN

## 2012-08-05 MED ORDER — TETANUS-DIPHTH-ACELL PERTUSSIS 5-2.5-18.5 LF-MCG/0.5 IM SUSP
0.5000 mL | Freq: Once | INTRAMUSCULAR | Status: AC
  Administered 2012-08-05: 0.5 mL via INTRAMUSCULAR
  Filled 2012-08-05: qty 0.5
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
## 2012-08-05 NOTE — ED Provider Notes (Signed)
History     CSN: [id number]  Arrival date & time 08/05/12  4098   First MD Initiated Contact with Patient 08/05/12 2013      Chief Complaint  Patient presents with  . Facial Injury   HPI  History provided by the patient. Patient is a 57 year old female with history of hypertension and diabetes who presents with complaints of nose injury and pain. Patient states that earlier this morning and afternoon she was getting into the trunk of her car when the trunk fell hitting her across the bridge of the nose. She had a small abrasion with bleeding as well as pain and swelling to the nose. Denies any nosebleed. Initially she had some slight lightheadedness and dizziness but sat down and apply ice to the area. Bleeding was controlled from the skin. Since that time patient has felt well besides continued pain and swelling to the nose. She also complains of some stuffiness to the nose. She denies any continued dizziness or lightheadedness. There was no syncope or loss of consciousness. She denies any other aggravating or alleviating factors. Denies any other associated symptoms.    Past Medical History  Diagnosis Date  . Arthritis   . Diabetes mellitus   . Hypertension     Past Surgical History  Procedure Date  . Hand surgery   . Carpal tunnel release     History reviewed. No pertinent family history.  History  Substance Use Topics  . Smoking status: Never Smoker   . Smokeless tobacco: Not on file  . Alcohol Use: No    OB History    Grav Para Term Preterm Abortions TAB SAB Ect Mult Living                  Review of Systems  Neurological: Positive for headaches. Negative for light-headedness.  All other systems reviewed and are negative.    Allergies  Levofloxacin; Ciprofloxacin; Contrast media; Ponstel; and Sulfa antibiotics  Home Medications   Current Outpatient Rx  Name  Route  Sig  Dispense  Refill  . ALPRAZOLAM 0.25 MG PO TABS   Oral   Take 0.25 mg by mouth 2  (two) times daily.         . ASPIRIN 81 MG PO TABS   Oral   Take 81 mg by mouth daily.         Marland Kitchen EZETIMIBE-SIMVASTATIN 10-20 MG PO TABS   Oral   Take 1 tablet by mouth every morning.         Marland Kitchen GLIMEPIRIDE 2 MG PO TABS   Oral   Take 2 mg by mouth daily before breakfast.         . HYDROCODONE-ACETAMINOPHEN 5-325 MG PO TABS   Oral   Take 1 tablet by mouth every 6 (six) hours as needed. For pain         . HYDROCODONE-ACETAMINOPHEN 5-325 MG PO TABS   Oral   Take 1 tablet by mouth every 6 (six) hours as needed for pain.   12 tablet   0   . METFORMIN HCL 1000 MG PO TABS   Oral   Take 1,000 mg by mouth 2 (two) times daily with a meal.         . SITAGLIPTIN PHOSPHATE 100 MG PO TABS   Oral   Take 100 mg by mouth daily.         Marland Kitchen VALSARTAN-HYDROCHLOROTHIAZIDE 160-12.5 MG PO TABS   Oral   Take 1 tablet by mouth  daily.           BP 118/73  Pulse 94  Temp 98.2 F (36.8 C) (Oral)  Resp 20  SpO2 100%  Physical Exam  Nursing note and vitals reviewed. Constitutional: She is oriented to person, place, and time. She appears well-developed and well-nourished. No distress.  HENT:  Head: Normocephalic.       Small linear mark and abrasion to the bridge of the nose. There is very mild stiff U. swelling with tenderness palpation. No gross deformity of the nasal bones. Nose is clear and dry. No signs of epistaxis. No septal hematomas. Airways patent.  Eyes: Conjunctivae normal and EOM are normal. Pupils are equal, round, and reactive to light.  Neck: Normal range of motion. Neck supple.  Cardiovascular: Normal rate and regular rhythm.   Pulmonary/Chest: Effort normal and breath sounds normal.  Abdominal: Soft.  Musculoskeletal: Normal range of motion.  Neurological: She is alert and oriented to person, place, and time.  Skin: Skin is warm and dry. No rash noted.  Psychiatric: She has a normal mood and affect. Her behavior is normal.    ED Course  Procedures       1. Contusion of nose   2. Abrasion of nose       MDM  8:15 PM patient seen and evaluated. Patient resting comfortably appears in no acute distress. Patient without significant deformity to the nose. No signs of nosebleed. No septal hematoma. Airways patent. There is only a small abrasion and marks to the bridge of the nose. There is diffuse tenderness to this area.        Angus Seller, [place] 08/06/12 2003

## 2012-08-05 NOTE — ED Notes (Signed)
Noted a small laceration to bridge of the nose , nose slightly swollen, pt stated it hurt. Ice given to pt as requested.

## 2012-08-07 NOTE — ED Provider Notes (Signed)
Medical screening examination/treatment/procedure(s) were performed by non-physician practitioner and as supervising physician I was immediately available for consultation/collaboration.  Boykin Baetz, MD 08/07/12 1337 

## 2012-09-30 ENCOUNTER — Encounter (HOSPITAL_COMMUNITY): Payer: Self-pay

## 2012-09-30 ENCOUNTER — Emergency Department (HOSPITAL_COMMUNITY)
Admission: EM | Admit: 2012-09-30 | Discharge: 2012-09-30 | Disposition: A | Discharge status: Home / Self Care | Payer: Medicare Other | Attending: Emergency Medicine | Admitting: Emergency Medicine

## 2012-09-30 DIAGNOSIS — R490 Dysphonia: Secondary | ICD-10-CM | POA: Insufficient documentation

## 2012-09-30 DIAGNOSIS — Z8739 Personal history of other diseases of the musculoskeletal system and connective tissue: Secondary | ICD-10-CM | POA: Insufficient documentation

## 2012-09-30 DIAGNOSIS — M545 Low back pain, unspecified: Secondary | ICD-10-CM | POA: Insufficient documentation

## 2012-09-30 DIAGNOSIS — M549 Dorsalgia, unspecified: Secondary | ICD-10-CM

## 2012-09-30 DIAGNOSIS — G4762 Sleep related leg cramps: Secondary | ICD-10-CM

## 2012-09-30 DIAGNOSIS — J029 Acute pharyngitis, unspecified: Secondary | ICD-10-CM

## 2012-09-30 DIAGNOSIS — I10 Essential (primary) hypertension: Secondary | ICD-10-CM | POA: Insufficient documentation

## 2012-09-30 DIAGNOSIS — R739 Hyperglycemia, unspecified: Secondary | ICD-10-CM

## 2012-09-30 DIAGNOSIS — Z79899 Other long term (current) drug therapy: Secondary | ICD-10-CM | POA: Insufficient documentation

## 2012-09-30 DIAGNOSIS — R252 Cramp and spasm: Secondary | ICD-10-CM | POA: Insufficient documentation

## 2012-09-30 DIAGNOSIS — Z7982 Long term (current) use of aspirin: Secondary | ICD-10-CM | POA: Insufficient documentation

## 2012-09-30 DIAGNOSIS — G8929 Other chronic pain: Secondary | ICD-10-CM | POA: Insufficient documentation

## 2012-09-30 DIAGNOSIS — E1169 Type 2 diabetes mellitus with other specified complication: Secondary | ICD-10-CM | POA: Insufficient documentation

## 2012-09-30 DIAGNOSIS — R059 Cough, unspecified: Secondary | ICD-10-CM

## 2012-09-30 DIAGNOSIS — R634 Abnormal weight loss: Secondary | ICD-10-CM | POA: Insufficient documentation

## 2012-09-30 DIAGNOSIS — R05 Cough: Secondary | ICD-10-CM | POA: Insufficient documentation

## 2012-09-30 LAB — POCT I-STAT, CHEM 8
BUN: 15 mg/dL (ref 6–23)
Potassium: 3.6 mEq/L (ref 3.5–5.1)
Sodium: 137 mEq/L (ref 135–145)
TCO2: 26 mmol/L (ref 0–100)

## 2012-09-30 MED ORDER — HYDROCODONE-ACETAMINOPHEN 5-325 MG PO TABS
1.0000 | ORAL_TABLET | Freq: Once | ORAL | Status: AC
  Administered 2012-09-30: 1 via ORAL
  Filled 2012-09-30: qty 1

## 2012-09-30 MED ORDER — HYDROCODONE-ACETAMINOPHEN 5-325 MG PO TABS: 1.0000 | ORAL_TABLET | Freq: Four times a day (QID) | ORAL | Status: DC | PRN

## 2012-09-30 NOTE — ED Notes (Signed)
Pt ambulatory.

## 2012-09-30 NOTE — ED Provider Notes (Signed)
History  This chart was scribed for non-physician practitioner, Jaci Carrel, PA-C working with Carleene Cooper III, MD by Shari Heritage, ED Scribe. This patient was seen in room TR09C/TR09C and the patient's care was started at 2138.   CSN: [id number]  Arrival date & time 09/30/12  1843   First MD Initiated Contact with Patient 09/30/12 2138      Chief Complaint  Patient presents with  . Back Pain    The history is provided by the patient. No language interpreter was used.    HPI Comments: Sarah Webster is a 57 y.o. female with a history of chronic back pain who presents to the Emergency Department complaining of moderate, constant lower back pain onset 2 days ago. She also complains of bilateral foot and leg cramping. Patient denies any new injuries or trauma. She states that she has had some recent weight loss. Patient denies weakness, bladder incontinence or bowel incontinence. Patient also denies tingling or numbness in bilateral lower extremities. She has taken Vicodin for pain management, but states that she has run out. She has also taken Motrin with some relief.   Patient has a second complaint of voice hoarseness and productive cough onset 4 days ago. Patient says that she has been gargling with salt water. Patient says that she has a friend who has had URI-like symptoms. Medical history includes diabetes, hypertension and arthritis.   Past Medical History  Diagnosis Date  . Arthritis   . Diabetes mellitus   . Hypertension     Past Surgical History  Procedure Laterality Date  . Hand surgery    . Carpal tunnel release    . Abdominal hysterectomy      No family history on file.  History  Substance Use Topics  . Smoking status: Never Smoker   . Smokeless tobacco: Never Used  . Alcohol Use: No    OB History   Grav Para Term Preterm Abortions TAB SAB Ect Mult Living                  Review of Systems A complete 10 system review of systems was obtained and all systems  are negative except as noted in the HPI and PMH.   Allergies  Levofloxacin; Ciprofloxacin; Contrast media; Ponstel; and Sulfa antibiotics  Home Medications   Current Outpatient Rx  Name  Route  Sig  Dispense  Refill  . ALPRAZolam (XANAX) 0.25 MG tablet   Oral   Take 0.25 mg by mouth daily.          Marland Kitchen aspirin EC 81 MG tablet   Oral   Take 81 mg by mouth daily.         . Cholecalciferol (VITAMIN D) 2000 UNITS CAPS   Oral   Take 2,000 Units by mouth daily.         Marland Kitchen ezetimibe-simvastatin (VYTORIN) 10-20 MG per tablet   Oral   Take 1 tablet by mouth every morning.         Marland Kitchen glimepiride (AMARYL) 2 MG tablet   Oral   Take 2 mg by mouth daily before breakfast.         . ibuprofen (ADVIL,MOTRIN) 800 MG tablet   Oral   Take 800 mg by mouth daily as needed for pain.         . metFORMIN (GLUCOPHAGE) 1000 MG tablet   Oral   Take 1,000 mg by mouth 2 (two) times daily with a meal.         .  sitaGLIPtin (JANUVIA) 100 MG tablet   Oral   Take 100 mg by mouth daily.         . valsartan-hydrochlorothiazide (DIOVAN-HCT) 160-12.5 MG per tablet   Oral   Take 1 tablet by mouth daily.           Triage Vitals: BP 132/86  Pulse 93  Temp(Src) 98.3 F (36.8 C) (Oral)  Resp 14  Wt 235 lb (106.595 kg)  SpO2 99%  Physical Exam  Nursing note and vitals reviewed. Constitutional: She is oriented to person, place, and time. She appears well-developed and well-nourished. No distress.  HENT:  Head: Normocephalic and atraumatic. No trismus in the jaw.  Right Ear: Tympanic membrane, external ear and ear canal normal.  Left Ear: Tympanic membrane, external ear and ear canal normal.  Nose: Nose normal. No rhinorrhea. Right sinus exhibits no maxillary sinus tenderness and no frontal sinus tenderness. Left sinus exhibits no maxillary sinus tenderness and no frontal sinus tenderness.  Mouth/Throat: Uvula is midline and mucous membranes are normal. Normal dentition. No dental  abscesses or edematous. Posterior oropharyngeal edema present. No oropharyngeal exudate, posterior oropharyngeal erythema or tonsillar abscesses.  No submental edema, tongue not elevated, no trismus. No impending airway obstruction; Pt able to speak full sentences, swallow intact, no drooling, stridor, or tonsillar/uvula displacement. No palatal petechia  Eyes: Conjunctivae and EOM are normal. Pupils are equal, round, and reactive to light. No scleral icterus.  Neck: Trachea normal, normal range of motion and full passive range of motion without pain. Neck supple. No spinous process tenderness and no muscular tenderness present. No rigidity. Normal range of motion present. No Brudzinski's sign noted.  Flexion and extension of neck without pain or difficulty. Able to breath without difficulty in extension.  Cardiovascular: Normal rate, regular rhythm and intact distal pulses.  Exam reveals no gallop and no friction rub.   No murmur heard. Intact distal pulses, capillary refill less than 3 seconds bilaterally.   Pulmonary/Chest: Effort normal and breath sounds normal. No stridor. No respiratory distress. She has no wheezes. She has no rales. She exhibits no tenderness.  Abdominal: Soft. There is no tenderness.  No obvious evidence of splenomegaly. Non ttp.   Musculoskeletal: Normal range of motion.  Bilateral lower extremities nontender without color change, baseline range of motion of extremities with Pt has increased pain w ROM of lumbar spine. Pain w ambulation. Negative straight leg test bilaterally.   Lymphadenopathy:       Head (right side): No preauricular and no posterior auricular adenopathy present.       Head (left side): No preauricular and no posterior auricular adenopathy present.    She has no cervical adenopathy.  Neurological: She is alert and oriented to person, place, and time. She has normal strength and normal reflexes. No sensory deficit. Abnormal gait: no ataxia, slowed and  hunched d/t pain   Sensation at baseline for light touch in all 4 distal extremities, motor symmetric & bilateral 5/5 (hips: abduction, adduction, flexion; knee: flexion & extension; foot: dorsiflexion, plantar flexion, toes: dorsi flexion)  Skin: Skin is warm and dry. No rash noted. She is not diaphoretic. No erythema. No pallor.  Psychiatric: She has a normal mood and affect.    ED Course  Procedures (including critical care time) DIAGNOSTIC STUDIES: Oxygen Saturation is 99% on room air, normal by my interpretation.    COORDINATION OF CARE: 9:49 PM- Patient informed of current plan for treatment and evaluation and agrees with plan at this time.  Labs Reviewed  POCT I-STAT, CHEM 8 - Abnormal; Notable for the following:    Glucose, Bld 229 (*)    Hemoglobin 11.9 (*)    HCT 35.0 (*)    All other components within normal limits   No results found.   No diagnosis found.    MDM  Hyperglycemia- Advised PCP follow up, closer BG monitoring and cont diet  leg cramping at night- electrolytes w in normal limits, good kidney function, likely etiology of peripheral neuropathy vs PVD. Further evaluation PCP necessary back pain (chronic)-  No neurological deficits and normal neuro exam.  Patient can walk but states is painful.  No loss of bowel or bladder control.  No concern for cauda equina.  No fever, night sweats, weight loss, h/o cancer, IVDU.    sore throat/cough- Pt afebrile without tonsillar exudate; diagnosis of viral syndrom. No abx indicated. DC w symptomatic tx for pain  Pt does not appear dehydrated, but did discuss importance of water rehydration. Presentation non concerning for PTA or infxn spread to soft tissue. No trismus or uvula deviation. Specific return precautions discussed. Pt able to drink water in ED without difficulty with intact air way. Recommended PCP follow up.         I personally performed the services described in this documentation, which was  scribed in my presence. The recorded information has been reviewed and is accurate.      Jaci Carrel, [place] 09/30/12 2302
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
## 2012-10-01 NOTE — ED Provider Notes (Signed)
Medical screening examination/treatment/procedure(s) were performed by non-physician practitioner and as supervising physician I was immediately available for consultation/collaboration.   Carleene Cooper III, MD 10/01/12 [phone]

## 2012-11-06 ENCOUNTER — Other Ambulatory Visit: Payer: Self-pay

## 2012-11-06 DIAGNOSIS — Z1231 Encounter for screening mammogram for malignant neoplasm of breast: Secondary | ICD-10-CM

## 2012-12-13 ENCOUNTER — Ambulatory Visit
Admission: RE | Admit: 2012-12-13 | Discharge: 2012-12-13 | Disposition: A | Discharge status: Home / Self Care | Payer: Medicare Other | Source: Ambulatory Visit

## 2012-12-13 DIAGNOSIS — Z1231 Encounter for screening mammogram for malignant neoplasm of breast: Secondary | ICD-10-CM

## 2013-01-24 ENCOUNTER — Encounter (HOSPITAL_COMMUNITY): Payer: Self-pay

## 2013-01-24 ENCOUNTER — Emergency Department (HOSPITAL_COMMUNITY)
Admission: EM | Admit: 2013-01-24 | Discharge: 2013-01-24 | Disposition: A | Discharge status: Home / Self Care | Payer: Medicare Other | Attending: Emergency Medicine | Admitting: Emergency Medicine

## 2013-01-24 DIAGNOSIS — M129 Arthropathy, unspecified: Secondary | ICD-10-CM | POA: Insufficient documentation

## 2013-01-24 DIAGNOSIS — X500XXA Overexertion from strenuous movement or load, initial encounter: Secondary | ICD-10-CM | POA: Insufficient documentation

## 2013-01-24 DIAGNOSIS — E119 Type 2 diabetes mellitus without complications: Secondary | ICD-10-CM | POA: Insufficient documentation

## 2013-01-24 DIAGNOSIS — Y93H2 Activity, gardening and landscaping: Secondary | ICD-10-CM | POA: Insufficient documentation

## 2013-01-24 DIAGNOSIS — Z7982 Long term (current) use of aspirin: Secondary | ICD-10-CM | POA: Insufficient documentation

## 2013-01-24 DIAGNOSIS — G8929 Other chronic pain: Secondary | ICD-10-CM

## 2013-01-24 DIAGNOSIS — Y92009 Unspecified place in unspecified non-institutional (private) residence as the place of occurrence of the external cause: Secondary | ICD-10-CM | POA: Insufficient documentation

## 2013-01-24 DIAGNOSIS — I10 Essential (primary) hypertension: Secondary | ICD-10-CM | POA: Insufficient documentation

## 2013-01-24 DIAGNOSIS — Z79899 Other long term (current) drug therapy: Secondary | ICD-10-CM | POA: Insufficient documentation

## 2013-01-24 DIAGNOSIS — M549 Dorsalgia, unspecified: Secondary | ICD-10-CM | POA: Insufficient documentation

## 2013-01-24 MED ORDER — OXYCODONE-ACETAMINOPHEN 5-325 MG PO TABS: 1.0000 | ORAL_TABLET | ORAL | Status: DC | PRN

## 2013-01-24 NOTE — ED Notes (Signed)
Pt presents with onset of low back pain while working in yard yesterday.  Pt reports standing up too fast (thinking she saw a snake) with onset of low back pain.  Pt reports pain radiates down R leg, reports numbness to R leg.  Pt denies any urinary or fecal incontinence.

## 2013-01-24 NOTE — ED Provider Notes (Signed)
History    CSN: [id number] Arrival date & time 01/24/13  1256  First MD Initiated Contact with Patient 01/24/13 1321     Chief Complaint  Patient presents with  . Back Pain   (Consider location/radiation/quality/duration/timing/severity/associated sxs/prior Treatment) The history is provided by the patient and medical records.   Pt presents to the ED for back pain.  States she was working in her yard yesterday, knealt down on the ground pulling weeds when she thought she saw a snake.  Pt states she thinks she jumped up a little too quickly and "tweaked" her back.  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  Told she needs surgery but is unwilling to do have it done at this time.  No numbness or paresthesias of LE.  No loss of bowel or bladder function.  Pain now described as a constant, dull ache.  Taken ibuprofen without significant relief.  Past Medical History  Diagnosis Date  . Arthritis   . Diabetes mellitus   . Hypertension    Past Surgical History  Procedure Laterality Date  . Hand surgery    . Carpal tunnel release    . Abdominal hysterectomy     History reviewed. No pertinent family history. History  Substance Use Topics  . Smoking status: Never Smoker   . Smokeless tobacco: Never Used  . Alcohol Use: No   OB History   Grav Para Term Preterm Abortions TAB SAB Ect Mult Living                 Review of Systems  Musculoskeletal: Positive for back pain.  All other systems reviewed and are negative.    Allergies  Levofloxacin; Ciprofloxacin; Contrast media; Ponstel; and Sulfa antibiotics  Home Medications   Current Outpatient Rx  Name  Route  Sig  Dispense  Refill  . ALPRAZolam (XANAX) 0.25 MG tablet   Oral   Take 0.25 mg by mouth daily.          Marland Kitchen aspirin EC 81 MG tablet   Oral   Take 81 mg by mouth daily.         . Cholecalciferol (VITAMIN D) 2000 UNITS CAPS   Oral   Take 2,000 Units by mouth  daily.         Marland Kitchen ezetimibe-simvastatin (VYTORIN) 10-20 MG per tablet   Oral   Take 1 tablet by mouth every morning.         Marland Kitchen glimepiride (AMARYL) 2 MG tablet   Oral   Take 2 mg by mouth daily before breakfast.         . HYDROcodone-acetaminophen (NORCO/VICODIN) 5-325 MG per tablet   Oral   Take 1 tablet by mouth every 6 (six) hours as needed for pain.   15 tablet   0   . metFORMIN (GLUCOPHAGE) 1000 MG tablet   Oral   Take 1,000 mg by mouth 2 (two) times daily with a meal.         . sitaGLIPtin (JANUVIA) 100 MG tablet   Oral   Take 100 mg by mouth daily.         . valsartan-hydrochlorothiazide (DIOVAN-HCT) 160-12.5 MG per tablet   Oral   Take 1 tablet by mouth daily.          BP 146/98  Pulse 97  Temp(Src) 98.4 F (36.9 C) (Oral)  Resp 18  SpO2 99%  Physical Exam  Nursing note  and vitals reviewed. Constitutional: She is oriented to person, place, and time. She appears well-developed and well-nourished.  HENT:  Head: Normocephalic and atraumatic.  Mouth/Throat: Oropharynx is clear and moist.  Eyes: Conjunctivae and EOM are normal. Pupils are equal, round, and reactive to light.  Neck: Normal range of motion.  Cardiovascular: Normal rate, regular rhythm and normal heart sounds.   Pulmonary/Chest: Effort normal and breath sounds normal.  Musculoskeletal: Normal range of motion.       Lumbar back: She exhibits tenderness, bony tenderness and pain. She exhibits normal range of motion, no swelling, no edema, no deformity, no laceration, no spasm and normal pulse.  Diffuse TTP of LS with midline tenderness, strong distal pulse, sensation intact, normal gait  Neurological: She is alert and oriented to person, place, and time.  Skin: Skin is warm and dry.  Psychiatric: She has a normal mood and affect.    ED Course  Procedures (including critical care time) Labs Reviewed - No data to display No results found.  1. Back pain, chronic     MDM   Acute on  chronic back pain.  No concern for cauda equina.  Pt ambulatory around the ED several times.  Rx percocet.  FU with PCP if sx not improving in the next few days.  Discussed plan with pt, she agreed.  Return precautions advised.  Garlon Hatchet, PA-C 01/24/13 1544
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
## 2013-01-26 NOTE — ED Provider Notes (Signed)
Medical screening examination/treatment/procedure(s) were performed by non-physician practitioner and as supervising physician I was immediately available for consultation/collaboration.  Derwood Kaplan, MD 01/26/13 [phone]

## 2013-07-08 ENCOUNTER — Encounter (HOSPITAL_COMMUNITY): Payer: Self-pay | Admitting: Emergency Medicine

## 2013-07-08 ENCOUNTER — Emergency Department (INDEPENDENT_AMBULATORY_CARE_PROVIDER_SITE_OTHER)
Admission: EM | Admit: 2013-07-08 | Discharge: 2013-07-08 | Disposition: A | Discharge status: Home / Self Care | Payer: Medicare HMO | Source: Home / Self Care | Attending: Family Medicine | Admitting: Family Medicine

## 2013-07-08 DIAGNOSIS — B373 Candidiasis of vulva and vagina: Secondary | ICD-10-CM

## 2013-07-08 DIAGNOSIS — B3731 Acute candidiasis of vulva and vagina: Secondary | ICD-10-CM

## 2013-07-08 LAB — POCT URINALYSIS DIP (DEVICE)
Bilirubin Urine: NEGATIVE
Glucose, UA: 500 mg/dL — AB
Hgb urine dipstick: NEGATIVE
Ketones, ur: NEGATIVE mg/dL
Leukocytes, UA: NEGATIVE
Nitrite: NEGATIVE
PH: 5.5 (ref 5.0–8.0)
PROTEIN: NEGATIVE mg/dL
UROBILINOGEN UA: 0.2 mg/dL (ref 0.0–1.0)

## 2013-07-08 MED ORDER — FLUCONAZOLE 150 MG PO TABS: 150.0000 mg | ORAL_TABLET | Freq: Once | ORAL | Status: DC

## 2013-07-08 MED ORDER — TERCONAZOLE 80 MG VA SUPP: 80.0000 mg | Freq: Every day | VAGINAL | Status: DC

## 2013-07-08 NOTE — ED Provider Notes (Signed)
CSN: [id number]     Arrival date & time 07/08/13  1357 History   First MD Initiated Contact with Patient 07/08/13 1552     Chief Complaint  Patient presents with  . Urinary Tract Infection   (Consider location/radiation/quality/duration/timing/severity/associated sxs/prior Treatment) Patient is a 58 y.o. female presenting with urinary tract infection. The history is provided by the patient.  Urinary Tract Infection This is a new problem. The current episode started more than 1 week ago. The problem has not changed since onset.Pertinent negatives include no chest pain, no abdominal pain, no headaches and no shortness of breath. Associated symptoms comments: Vag itching and also coughing at hs..    Past Medical History  Diagnosis Date  . Arthritis   . Diabetes mellitus   . Hypertension    Past Surgical History  Procedure Laterality Date  . Hand surgery    . Carpal tunnel release    . Abdominal hysterectomy     History reviewed. No pertinent family history. History  Substance Use Topics  . Smoking status: Never Smoker   . Smokeless tobacco: Never Used  . Alcohol Use: No   OB History   Grav Para Term Preterm Abortions TAB SAB Ect Mult Living                 Review of Systems  Constitutional: Negative.  Negative for fever and chills.  Respiratory: Positive for cough. Negative for shortness of breath and wheezing.   Cardiovascular: Negative for chest pain.  Gastrointestinal: Negative.  Negative for abdominal pain.  Genitourinary: Negative for dysuria, urgency, frequency and vaginal discharge.  Neurological: Negative for headaches.    Allergies  Levofloxacin; Ciprofloxacin; Contrast media; Ponstel; and Sulfa antibiotics  Home Medications   Current Outpatient Rx  Name  Route  Sig  Dispense  Refill  . ALPRAZolam (XANAX) 0.25 MG tablet   Oral   Take 0.25 mg by mouth daily.          Marland Kitchen aspirin EC 81 MG tablet   Oral   Take 81 mg by mouth daily.         .  Cholecalciferol (VITAMIN D) 2000 UNITS CAPS   Oral   Take 2,000 Units by mouth daily.         Marland Kitchen ezetimibe-simvastatin (VYTORIN) 10-20 MG per tablet   Oral   Take 1 tablet by mouth every morning.         . fluconazole (DIFLUCAN) 150 MG tablet   Oral   Take 1 tablet (150 mg total) by mouth once. Repeat in 1 week.   1 tablet   1   . glimepiride (AMARYL) 2 MG tablet   Oral   Take 2 mg by mouth daily before breakfast.         . HYDROcodone-acetaminophen (NORCO/VICODIN) 5-325 MG per tablet   Oral   Take 1 tablet by mouth every 6 (six) hours as needed for pain.   15 tablet   0   . metFORMIN (GLUCOPHAGE) 1000 MG tablet   Oral   Take 1,000 mg by mouth 2 (two) times daily with a meal.         . oxyCODONE-acetaminophen (PERCOCET/ROXICET) 5-325 MG per tablet   Oral   Take 1 tablet by mouth every 4 (four) hours as needed for pain.   10 tablet   0   . sitaGLIPtin (JANUVIA) 100 MG tablet   Oral   Take 100 mg by mouth daily.         Marland Kitchen  terconazole (TERAZOL 3) 80 MG vaginal suppository   Vaginal   Place 1 suppository (80 mg total) vaginally at bedtime.   3 suppository   0   . valsartan-hydrochlorothiazide (DIOVAN-HCT) 160-12.5 MG per tablet   Oral   Take 1 tablet by mouth daily.          BP 104/63  Pulse 80  Temp(Src) 97.1 F (36.2 C) (Oral)  Resp 16  SpO2 98% Physical Exam  Nursing note and vitals reviewed. Constitutional: She is oriented to person, place, and time. She appears well-developed and well-nourished.  Abdominal: Soft. Bowel sounds are normal. There is no tenderness.  Genitourinary: Vagina normal.    There is no tenderness or lesion on the right labia. There is no tenderness or lesion on the left labia. No erythema around the vagina. No vaginal discharge found.  Neurological: She is alert and oriented to person, place, and time.  Skin: Skin is warm and dry.    ED Course  Procedures (including critical care time) Labs Review Labs Reviewed    POCT URINALYSIS DIP (DEVICE) - Abnormal; Notable for the following:    Glucose, UA 500 (*)    All other components within normal limits   Imaging Review No results found.  EKG Interpretation    Date/Time:    Ventricular Rate:    PR Interval:    QRS Duration:   QT Interval:    QTC Calculation:   R Axis:     Text Interpretation:              MDM      Billy Fischer, MD 07/08/13 2059

## 2013-07-08 NOTE — ED Notes (Signed)
C/o she has a burning sensation in her perineal area, and doesn't know if it's a UTI or a yeast infection

## 2013-07-08 NOTE — Discharge Instructions (Signed)
Use medicine as needed, see your doctor if further problems. °

## 2013-11-04 ENCOUNTER — Emergency Department (HOSPITAL_COMMUNITY)
Admission: EM | Admit: 2013-11-04 | Discharge: 2013-11-04 | Disposition: A | Discharge status: Home / Self Care | Payer: Medicare HMO | Attending: Emergency Medicine | Admitting: Emergency Medicine

## 2013-11-04 ENCOUNTER — Encounter (HOSPITAL_COMMUNITY): Payer: Self-pay | Admitting: Emergency Medicine

## 2013-11-04 DIAGNOSIS — J020 Streptococcal pharyngitis: Secondary | ICD-10-CM | POA: Diagnosis not present

## 2013-11-04 DIAGNOSIS — E119 Type 2 diabetes mellitus without complications: Secondary | ICD-10-CM | POA: Diagnosis not present

## 2013-11-04 DIAGNOSIS — Z79899 Other long term (current) drug therapy: Secondary | ICD-10-CM | POA: Insufficient documentation

## 2013-11-04 DIAGNOSIS — Z7982 Long term (current) use of aspirin: Secondary | ICD-10-CM | POA: Diagnosis not present

## 2013-11-04 DIAGNOSIS — M129 Arthropathy, unspecified: Secondary | ICD-10-CM | POA: Insufficient documentation

## 2013-11-04 DIAGNOSIS — J029 Acute pharyngitis, unspecified: Secondary | ICD-10-CM | POA: Diagnosis present

## 2013-11-04 DIAGNOSIS — I10 Essential (primary) hypertension: Secondary | ICD-10-CM | POA: Insufficient documentation

## 2013-11-04 LAB — CBG MONITORING, ED: Glucose-Capillary: 205 mg/dL — ABNORMAL HIGH (ref 70–99)

## 2013-11-04 LAB — RAPID STREP SCREEN (MED CTR MEBANE ONLY): Streptococcus, Group A Screen (Direct): POSITIVE — AB

## 2013-11-04 MED ORDER — PENICILLIN V POTASSIUM 250 MG PO TABS: 500.0000 mg | ORAL_TABLET | Freq: Two times a day (BID) | ORAL | Status: AC

## 2013-11-04 NOTE — ED Notes (Signed)
Per pt sts 3 days of sore throat.

## 2013-11-04 NOTE — ED Notes (Signed)
Unable to locate patient for triage x3

## 2013-11-04 NOTE — ED Provider Notes (Signed)
CSN: [id number]     Arrival date & time 11/04/13  1422 History  This chart was scribed for non-physician practitioner Jamse Mead, PA-C working with Saddie Benders. Dorna Mai, MD by Zettie Pho, ED Scribe. This patient was seen in room TR11C/TR11C and the patient's care was started at 5:55 PM.    Chief Complaint  Patient presents with  . Sore Throat   The history is provided by the patient. No language interpreter was used.   HPI Comments: Sarah Webster is a 58 y.o. female who presents to the Emergency Department complaining of a constant sore throat onset 5 days ago that she states is exacerbated with swallowing both solids and liquids. She reports using an OTC chloraseptic spray at home without significant relief. Patient reports that she has been exposed to sick contacts with similar symptoms at home. She denies fever, chest pain, shortness of breath, congestion, cough. Patient has allergies to levofloxacin, ciprofloxacin, sulfa antibiotics, and Ponstel. Patient has a history of arthritis, DM, and HTN.   Past Medical History  Diagnosis Date  . Arthritis   . Diabetes mellitus   . Hypertension    Past Surgical History  Procedure Laterality Date  . Hand surgery    . Carpal tunnel release    . Abdominal hysterectomy     History reviewed. No pertinent family history. History  Substance Use Topics  . Smoking status: Never Smoker   . Smokeless tobacco: Never Used  . Alcohol Use: No   OB History   Grav Para Term Preterm Abortions TAB SAB Ect Mult Living                 Review of Systems  Constitutional: Negative for fever.  HENT: Positive for sore throat. Negative for congestion.   Respiratory: Negative for cough and shortness of breath.   Cardiovascular: Negative for chest pain.  All other systems reviewed and are negative.   Allergies  Levofloxacin; Ciprofloxacin; Contrast media; Ponstel; and Sulfa antibiotics  Home Medications   Prior to Admission medications   Medication Sig  Start Date End Date Taking? Authorizing Provider  ALPRAZolam (XANAX) 0.25 MG tablet Take 0.25 mg by mouth 2 (two) times daily as needed for anxiety.    Yes Historical Provider, MD  aspirin 81 MG tablet Take 81 mg by mouth daily.   Yes Historical Provider, MD  Cholecalciferol (VITAMIN D) 2000 UNITS CAPS Take 2,000 Units by mouth daily.   Yes Historical Provider, MD  Dapagliflozin Propanediol (FARXIGA) 5 MG TABS Take 5 mg by mouth daily.   Yes Historical Provider, MD  ezetimibe-simvastatin (VYTORIN) 10-20 MG per tablet Take 1 tablet by mouth every morning.   Yes Historical Provider, MD  glimepiride (AMARYL) 2 MG tablet Take 2 mg by mouth daily with breakfast.   Yes Historical Provider, MD  HYDROcodone-acetaminophen (NORCO/VICODIN) 5-325 MG per tablet Take 1 tablet by mouth every 6 (six) hours as needed for pain. 09/30/12  Yes Lisette Paz, PA-C  metFORMIN (GLUCOPHAGE) 1000 MG tablet Take 1,000 mg by mouth 2 (two) times daily with a meal.   Yes Historical Provider, MD  traMADol (ULTRAM) 50 MG tablet Take 50 mg by mouth every 6 (six) hours as needed for moderate pain.   Yes Historical Provider, MD   Triage Vitals: BP 139/88  Pulse 89  Temp(Src) 97.8 F (36.6 C) (Oral)  Resp 18  SpO2 100%  Physical Exam  Nursing note and vitals reviewed. Constitutional: She is oriented to person, place, and time. She appears well-developed  and well-nourished. No distress.  HENT:  Head: Normocephalic and atraumatic.  Mouth/Throat: Oropharyngeal exudate present.  Mild erythema and swelling identified to the tonsils bilaterally with exudate noted. Mild posterior oropharynx swelling noted with exudate. Uvula midline with symmetrical elevation. Negative uvula deviation. Negative trismus.  Eyes: Conjunctivae and EOM are normal. Pupils are equal, round, and reactive to light. Right eye exhibits no discharge. Left eye exhibits no discharge.  Neck: Normal range of motion. Neck supple. No tracheal deviation present.   Negative neck stiffness Negative nuchal rigidity Negative digital signs Anterior cervical lymphadenopathy - soft, mobile, tender upon palpation mildly  Cardiovascular: Normal rate, regular rhythm and normal heart sounds.   Pulses:      Radial pulses are 2+ on the right side, and 2+ on the left side.  Pulmonary/Chest: Effort normal and breath sounds normal. No respiratory distress. She has no wheezes. She has no rales.  Patient is able to speak in full sentences without difficulty Negative use of accessory muscles Negative stridor Negative active drooling  Musculoskeletal: Normal range of motion.  Full ROM to upper and lower extremities without difficulty noted, negative ataxia noted.  Lymphadenopathy:    She has cervical adenopathy.  Neurological: She is alert and oriented to person, place, and time. No cranial nerve deficit. She exhibits normal muscle tone. Coordination normal.  Cranial nerves III-XII grossly intact  Skin: Skin is warm and dry.  Psychiatric: She has a normal mood and affect. Her behavior is normal.    ED Course  Procedures (including critical care time)  DIAGNOSTIC STUDIES: Oxygen Saturation is 100% on room air, normal by my interpretation.    COORDINATION OF CARE: 4:45 PM- Ordered a rapid strep screen.   6:00 PM- Discussed that strep test results were positive. Will discharge patient with oral penicillin to treat, as she stated that she does not want an injection. Return precautions given. Advised patient to follow up with her PCP tomorrow for her diabetes while on the course of antibiotics. Discussed treatment plan with patient at bedside and patient verbalized agreement.   Results for orders placed during the hospital encounter of 11/04/13  RAPID STREP SCREEN      Result Value Ref Range   Streptococcus, Group A Screen (Direct) POSITIVE (*) NEGATIVE  CBG MONITORING, ED      Result Value Ref Range   Glucose-Capillary 205 (*) 70 - 99 mg/dL     Labs  Review Labs Reviewed  RAPID STREP SCREEN - Abnormal; Notable for the following:    Streptococcus, Group A Screen (Direct) POSITIVE (*)    All other components within normal limits  CBG MONITORING, ED - Abnormal; Notable for the following:    Glucose-Capillary 205 (*)    All other components within normal limits    Imaging Review No results found.   EKG Interpretation None      MDM   Final diagnoses:  Streptococcal pharyngitis   Filed Vitals:   11/04/13 1639  BP: 139/88  Pulse: 89  Temp: 97.8 F (36.6 C)  TempSrc: Oral  Resp: 18  SpO2: 100%   I personally performed the services described in this documentation, which was scribed in my presence. The recorded information has been reviewed and is accurate.  Rapid strep test positive for strep - culture pending. Glucose level 205. Doubt peritonsillar abscess. Doubt retropharyngeal abscess. Patient presenting to the ED with positive streptococcal pharyngitis. Offered IM medications to be administered to patient - patient preferred by mouth medications. Patient has history of  diabetes, patient has close surveillance as well as close followup, has a primary care provider. As per patient, reports that she monitors her glucose levels daily and has good control - currently on medications. Patient reported that she has taken pencillin in the past without an issue. Patient stable, afebrile. Patient is not septic appearing. Discharged patient with antibiotics. Discussed with patient to rest and stay hydrated. Referred patient to primary care provider. Discussed with patient to closely monitor symptoms and if symptoms are to worsen or change to report back to the ED - strict return instructions given.  Patient agreed to plan of care, understood, all questions answered.   Jamse Mead, PA-C 11/05/13 1128

## 2013-11-04 NOTE — Discharge Instructions (Signed)
Please call your doctor for a followup appointment within 24-48 hours. When you talk to your doctor please let them know that you were seen in the emergency department and have them acquire all of your records so that they can discuss the findings with you and formulate a treatment plan to fully care for your new and ongoing problems. Please call and set-up an appointment with your primary care provider to be re-assessed within the next 24-48 hours Please rest and stay hydrated Please take antibiotics as prescribed and on a full stomach Please avoid any physical or strenuous activity Please continue to monitor symptoms closely and if symptoms are to worsen or change (fever greater than 101, chills, chest pain, short of breath, difficulty breathing, numbness, tingling, worsening changes to pain, swelling to the throat, inability to swallow, nausea, vomiting, abdominal pain, elevated sugar levels, dizziness, weakness) please report back to the ED immediately   Pharyngitis Pharyngitis is redness, pain, and swelling (inflammation) of your pharynx.  CAUSES  Pharyngitis is usually caused by infection. Most of the time, these infections are from viruses (viral) and are part of a cold. However, sometimes pharyngitis is caused by bacteria (bacterial). Pharyngitis can also be caused by allergies. Viral pharyngitis may be spread from person to person by coughing, sneezing, and personal items or utensils (cups, forks, spoons, toothbrushes). Bacterial pharyngitis may be spread from person to person by more intimate contact, such as kissing.  SIGNS AND SYMPTOMS  Symptoms of pharyngitis include:   Sore throat.   Tiredness (fatigue).   Low-grade fever.   Headache.  Joint pain and muscle aches.  Skin rashes.  Swollen lymph nodes.  Plaque-like film on throat or tonsils (often seen with bacterial pharyngitis). DIAGNOSIS  Your health care provider will ask you questions about your illness and your  symptoms. Your medical history, along with a physical exam, is often all that is needed to diagnose pharyngitis. Sometimes, a rapid strep test is done. Other lab tests may also be done, depending on the suspected cause.  TREATMENT  Viral pharyngitis will usually get better in 3 4 days without the use of medicine. Bacterial pharyngitis is treated with medicines that kill germs (antibiotics).  HOME CARE INSTRUCTIONS   Drink enough water and fluids to keep your urine clear or pale yellow.   Only take over-the-counter or prescription medicines as directed by your health care provider:   If you are prescribed antibiotics, make sure you finish them even if you start to feel better.   Do not take aspirin.   Get lots of rest.   Gargle with 8 oz of salt water ( tsp of salt per 1 qt of water) as often as every 1 2 hours to soothe your throat.   Throat lozenges (if you are not at risk for choking) or sprays may be used to soothe your throat. SEEK MEDICAL CARE IF:   You have large, tender lumps in your neck.  You have a rash.  You cough up green, yellow-brown, or bloody spit. SEEK IMMEDIATE MEDICAL CARE IF:   Your neck becomes stiff.  You drool or are unable to swallow liquids.  You vomit or are unable to keep medicines or liquids down.  You have severe pain that does not go away with the use of recommended medicines.  You have trouble breathing (not caused by a stuffy nose). MAKE SURE YOU:   Understand these instructions.  Will watch your condition.  Will get help right away if you are  not doing well or get worse. Document Released: 06/20/2005 Document Revised: 04/10/2013 Document Reviewed: 02/25/2013 [facility] Patient Information 2014 [place].

## 2013-11-07 NOTE — ED Provider Notes (Signed)
Medical screening examination/treatment/procedure(s) were performed by non-physician practitioner and as supervising physician I was immediately available for consultation/collaboration.  Roscoe Witts Y. Shanay Woolman, MD 11/07/13 2255 

## 2013-11-26 ENCOUNTER — Other Ambulatory Visit: Payer: Self-pay

## 2013-11-26 DIAGNOSIS — Z1231 Encounter for screening mammogram for malignant neoplasm of breast: Secondary | ICD-10-CM

## 2013-12-17 ENCOUNTER — Encounter (INDEPENDENT_AMBULATORY_CARE_PROVIDER_SITE_OTHER): Payer: Self-pay

## 2013-12-17 ENCOUNTER — Ambulatory Visit
Admission: RE | Admit: 2013-12-17 | Discharge: 2013-12-17 | Disposition: A | Discharge status: Home / Self Care | Payer: Medicare HMO | Source: Ambulatory Visit

## 2013-12-17 DIAGNOSIS — Z1231 Encounter for screening mammogram for malignant neoplasm of breast: Secondary | ICD-10-CM

## 2014-01-15 ENCOUNTER — Emergency Department (HOSPITAL_COMMUNITY)
Admission: EM | Admit: 2014-01-15 | Discharge: 2014-01-15 | Disposition: A | Discharge status: Home / Self Care | Payer: Medicare HMO | Attending: Emergency Medicine | Admitting: Emergency Medicine

## 2014-01-15 ENCOUNTER — Encounter (HOSPITAL_COMMUNITY): Payer: Self-pay | Admitting: Emergency Medicine

## 2014-01-15 ENCOUNTER — Emergency Department (HOSPITAL_COMMUNITY): Payer: Medicare HMO

## 2014-01-15 DIAGNOSIS — Y9389 Activity, other specified: Secondary | ICD-10-CM | POA: Insufficient documentation

## 2014-01-15 DIAGNOSIS — W260XXA Contact with knife, initial encounter: Secondary | ICD-10-CM | POA: Insufficient documentation

## 2014-01-15 DIAGNOSIS — E119 Type 2 diabetes mellitus without complications: Secondary | ICD-10-CM | POA: Insufficient documentation

## 2014-01-15 DIAGNOSIS — Z7982 Long term (current) use of aspirin: Secondary | ICD-10-CM | POA: Insufficient documentation

## 2014-01-15 DIAGNOSIS — Z79899 Other long term (current) drug therapy: Secondary | ICD-10-CM | POA: Insufficient documentation

## 2014-01-15 DIAGNOSIS — Z9889 Other specified postprocedural states: Secondary | ICD-10-CM | POA: Insufficient documentation

## 2014-01-15 DIAGNOSIS — M129 Arthropathy, unspecified: Secondary | ICD-10-CM | POA: Insufficient documentation

## 2014-01-15 DIAGNOSIS — S61209A Unspecified open wound of unspecified finger without damage to nail, initial encounter: Secondary | ICD-10-CM | POA: Insufficient documentation

## 2014-01-15 DIAGNOSIS — Z23 Encounter for immunization: Secondary | ICD-10-CM | POA: Insufficient documentation

## 2014-01-15 DIAGNOSIS — Y9289 Other specified places as the place of occurrence of the external cause: Secondary | ICD-10-CM | POA: Insufficient documentation

## 2014-01-15 DIAGNOSIS — I10 Essential (primary) hypertension: Secondary | ICD-10-CM | POA: Insufficient documentation

## 2014-01-15 DIAGNOSIS — W261XXA Contact with sword or dagger, initial encounter: Secondary | ICD-10-CM

## 2014-01-15 DIAGNOSIS — S61219A Laceration without foreign body of unspecified finger without damage to nail, initial encounter: Secondary | ICD-10-CM

## 2014-01-15 MED ORDER — TETANUS-DIPHTH-ACELL PERTUSSIS 5-2.5-18.5 LF-MCG/0.5 IM SUSP
0.5000 mL | Freq: Once | INTRAMUSCULAR | Status: AC
  Administered 2014-01-15: 0.5 mL via INTRAMUSCULAR
  Filled 2014-01-15: qty 0.5

## 2014-01-15 MED ORDER — CEPHALEXIN 500 MG PO CAPS: ORAL_CAPSULE | ORAL | Status: DC

## 2014-01-15 NOTE — ED Notes (Addendum)
PA at bedside. Performing suture

## 2014-01-15 NOTE — ED Provider Notes (Signed)
CSN: [id number]     Arrival date & time 01/15/14  1020 History  This chart was scribed for non-physician practitioner Cleatrice Burke, PA-C, working with Mirna Mires, MD by Ludger Nutting, ED Scribe. This patient was seen in room TR09C/TR09C and the patient's care was started at 11:37 AM.    Chief Complaint  Patient presents with  . finger laceration     The history is provided by the patient. No language interpreter was used.    HPI Comments: Sarah Webster is a 58 y.o. female who presents to the Emergency Department complaining of a laceration to the left thumb that occurred over 1 hour ago. Patient states she was attempting to cut open a tube of fixodent when the knife slipped. She has associated pain to the affected area which she rates as 5/10 currently. She is right hand dominant but occasionally uses her left hand. The bleeding is controlled currently. Her tetanus is out of date. She denies numbness, weakness.   Past Medical History  Diagnosis Date  . Arthritis   . Diabetes mellitus   . Hypertension    Past Surgical History  Procedure Laterality Date  . Hand surgery    . Carpal tunnel release    . Abdominal hysterectomy     No family history on file. History  Substance Use Topics  . Smoking status: Never Smoker   . Smokeless tobacco: Current User    Types: Chew  . Alcohol Use: No   OB History   Grav Para Term Preterm Abortions TAB SAB Ect Mult Living                 Review of Systems  Skin: Positive for wound (laceration).  Neurological: Negative for weakness and numbness.  All other systems reviewed and are negative.     Allergies  Ciprofloxacin; Contrast media; Levaquin; Ponstel; and Sulfa antibiotics  Home Medications   Prior to Admission medications   Medication Sig Start Date End Date Taking? Authorizing Provider  ALPRAZolam (XANAX) 0.25 MG tablet Take 0.25 mg by mouth 2 (two) times daily as needed for anxiety.     Historical Provider, MD  aspirin 81 MG  tablet Take 81 mg by mouth daily.    Historical Provider, MD  Cholecalciferol (VITAMIN D) 2000 UNITS CAPS Take 2,000 Units by mouth daily.    Historical Provider, MD  Dapagliflozin Propanediol (FARXIGA) 5 MG TABS Take 5 mg by mouth daily.    Historical Provider, MD  ezetimibe-simvastatin (VYTORIN) 10-20 MG per tablet Take 1 tablet by mouth every morning.    Historical Provider, MD  glimepiride (AMARYL) 2 MG tablet Take 2 mg by mouth daily with breakfast.    Historical Provider, MD  HYDROcodone-acetaminophen (NORCO/VICODIN) 5-325 MG per tablet Take 1 tablet by mouth every 6 (six) hours as needed for pain. 09/30/12   Lisette Paz, PA-C  metFORMIN (GLUCOPHAGE) 1000 MG tablet Take 1,000 mg by mouth 2 (two) times daily with a meal.    Historical Provider, MD  traMADol (ULTRAM) 50 MG tablet Take 50 mg by mouth every 6 (six) hours as needed for moderate pain.    Historical Provider, MD   BP 122/61  Pulse 86  Temp(Src) 98.5 F (36.9 C) (Oral)  Resp 17  Wt 222 lb (100.699 kg)  SpO2 100% Physical Exam  Nursing note and vitals reviewed. Constitutional: She is oriented to person, place, and time. She appears well-developed and well-nourished. No distress.  HENT:  Head: Normocephalic and atraumatic.  Right  Ear: External ear normal.  Left Ear: External ear normal.  Nose: Nose normal.  Mouth/Throat: Oropharynx is clear and moist.  Eyes: Conjunctivae are normal.  Neck: Normal range of motion.  Cardiovascular: Normal rate, regular rhythm and normal heart sounds.   Cap refill < 3 seconds.   Pulmonary/Chest: Effort normal and breath sounds normal. No stridor. No respiratory distress. She has no wheezes. She has no rales.  Abdominal: Soft. She exhibits no distension.  Musculoskeletal: Normal range of motion.  Neurological: She is alert and oriented to person, place, and time. She has normal strength.  Strength with flexion and extension 5/5 when tested against resistance.   Skin: Skin is warm and dry.  She is not diaphoretic. No erythema.  2 cm laceration to the palmer aspect of the left thumb at base.   Psychiatric: She has a normal mood and affect. Her behavior is normal.    ED Course  Procedures (including critical care time)  DIAGNOSTIC STUDIES: Oxygen Saturation is 100% on RA, normal by my interpretation.    COORDINATION OF CARE: 11:43 AM Will perform laceration repair. Discussed treatment plan with pt at bedside and pt agreed to plan.  12:03 PM LACERATION REPAIR PROCEDURE NOTE The patient's identification was confirmed and consent was obtained. This procedure was performed by Cleatrice Burke, PA-C, at 12:03 PM. Site: left thumb Sterile procedures observed Anesthetic used (type and amt): 2% lidocaine without epi, 2 ml Suture type/size:5-0 Ethilon  Length: 2 cm # of Sutures: 4 Technique:simple interrupted  Complexity: simple Antibx ointment applied Tetanus ordered Site anesthetized, irrigated with NS, explored without evidence of foreign body, wound well approximated, site covered with dry, sterile dressing.  Patient tolerated procedure well without complications. Instructions for care discussed verbally and patient provided with additional written instructions for homecare and f/u.    Labs Review Labs Reviewed - No data to display  Imaging Review Dg Finger Thumb Left  01/15/2014   CLINICAL DATA:  Soft tissue laceration of the left thumb.  EXAM: LEFT THUMB 2+V  COMPARISON:  None.  FINDINGS: There is no evidence of fracture or dislocation. There is no evidence of arthropathy or other focal bone abnormality. Soft tissue laceration along the anterior medial aspect of the left thumb at the level of the MCP joint.  IMPRESSION: No acute osseous abnormality of the left thumb.   Electronically Signed   By: Kathreen Devoid   On: 01/15/2014 11:38     EKG Interpretation None      MDM   Final diagnoses:  Finger laceration, initial encounter    Tdap booster given. Wound  cleaning complete with pressure irrigation, bottom of wound visualized, no foreign bodies appreciated. Laceration occurred < 8 hours prior to repair which was well tolerated. Pt is diabetic. Will give keflex. Discussed suture home care w pt and answered questions. Pt to f-u for wound check and suture removal in 7 days. Pt is hemodynamically stable w no complaints prior to dc.    I personally performed the services described in this documentation, which was scribed in my presence. The recorded information has been reviewed and is accurate.    Elwyn Lade, PA-C 01/15/14 1247

## 2014-01-15 NOTE — ED Notes (Signed)
Patient states was cutting open a tube of fixodent and the knife slipped and cut her L thumb.   Medium laceration to inside of thumb.  Bleeding controlled at this time.

## 2014-01-15 NOTE — Discharge Instructions (Signed)

## 2014-01-21 ENCOUNTER — Emergency Department (HOSPITAL_COMMUNITY)
Admission: EM | Admit: 2014-01-21 | Discharge: 2014-01-21 | Disposition: A | Discharge status: Home / Self Care | Payer: Medicare HMO | Attending: Emergency Medicine | Admitting: Emergency Medicine

## 2014-01-21 ENCOUNTER — Encounter (HOSPITAL_COMMUNITY): Payer: Self-pay | Admitting: Emergency Medicine

## 2014-01-21 DIAGNOSIS — M129 Arthropathy, unspecified: Secondary | ICD-10-CM | POA: Insufficient documentation

## 2014-01-21 DIAGNOSIS — I10 Essential (primary) hypertension: Secondary | ICD-10-CM | POA: Insufficient documentation

## 2014-01-21 DIAGNOSIS — E119 Type 2 diabetes mellitus without complications: Secondary | ICD-10-CM | POA: Insufficient documentation

## 2014-01-21 DIAGNOSIS — Z4802 Encounter for removal of sutures: Secondary | ICD-10-CM

## 2014-01-21 DIAGNOSIS — Z7982 Long term (current) use of aspirin: Secondary | ICD-10-CM | POA: Insufficient documentation

## 2014-01-21 DIAGNOSIS — Z79899 Other long term (current) drug therapy: Secondary | ICD-10-CM | POA: Insufficient documentation

## 2014-01-21 DIAGNOSIS — Z9889 Other specified postprocedural states: Secondary | ICD-10-CM | POA: Insufficient documentation

## 2014-01-21 NOTE — ED Provider Notes (Signed)
Medical screening examination/treatment/procedure(s) were performed by non-physician practitioner and as supervising physician I was immediately available for consultation/collaboration.   EKG Interpretation None      Rolland Porter, MD, Abram Sander   Janice Norrie, MD 01/21/14 [phone]

## 2014-01-21 NOTE — ED Provider Notes (Signed)
CSN: [id number]     Arrival date & time 01/21/14  1022 History  This chart was scribed for non-physician practitioner Harvie Heck working with Janice Norrie, MD by Donato Schultz, ED Scribe. This patient was seen in room TR05C/TR05C and the patient's care was started at 10:25 AM.    No chief complaint on file.  HPI Comments: Sarah Webster is a 58 y.o. female with a history of hypertension, DM, and arthritis who presents to the Emergency Department for the removal of sutures from her left thumb that were placed 6 days ago.  She denies experiencing drainage from the wound, fever, and chills.  The patient's PCP is Dr. Owens Shark.     Patient is a 58 y.o. female presenting with suture removal. The history is provided by the patient. No language interpreter was used.  Suture / Staple Removal   Past Medical History  Diagnosis Date  . Arthritis   . Diabetes mellitus   . Hypertension    Past Surgical History  Procedure Laterality Date  . Hand surgery    . Carpal tunnel release    . Abdominal hysterectomy     No family history on file. History  Substance Use Topics  . Smoking status: Never Smoker   . Smokeless tobacco: Current User    Types: Chew  . Alcohol Use: No   OB History   Grav Para Term Preterm Abortions TAB SAB Ect Mult Living                 Review of Systems  Constitutional: Negative for fever and chills.  Skin: Positive for wound. Negative for color change.  All other systems reviewed and are negative.     Allergies  Ciprofloxacin; Contrast media; Levaquin; Ponstel; and Sulfa antibiotics  Home Medications   Prior to Admission medications   Medication Sig Start Date End Date Taking? Authorizing Provider  ALPRAZolam (XANAX) 0.25 MG tablet Take 0.25 mg by mouth 2 (two) times daily as needed for anxiety.     Historical Provider, MD  aspirin 81 MG tablet Take 81 mg by mouth daily.    Historical Provider, MD  cephALEXin (KEFLEX) 500 MG capsule 2 caps po bid x 7 days 01/15/14    Elwyn Lade, PA-C  Cholecalciferol (VITAMIN D) 2000 UNITS CAPS Take 2,000 Units by mouth daily.    Historical Provider, MD  Dapagliflozin Propanediol (FARXIGA) 5 MG TABS Take 5 mg by mouth daily.    Historical Provider, MD  ezetimibe-simvastatin (VYTORIN) 10-20 MG per tablet Take 1 tablet by mouth every morning.    Historical Provider, MD  glimepiride (AMARYL) 2 MG tablet Take 2 mg by mouth daily with breakfast.    Historical Provider, MD  HYDROcodone-acetaminophen (NORCO/VICODIN) 5-325 MG per tablet Take 1 tablet by mouth every 6 (six) hours as needed for pain. 09/30/12   Lisette Paz, PA-C  metFORMIN (GLUCOPHAGE) 1000 MG tablet Take 1,000 mg by mouth 2 (two) times daily with a meal.    Historical Provider, MD  traMADol (ULTRAM) 50 MG tablet Take 50 mg by mouth every 6 (six) hours as needed for moderate pain.    Historical Provider, MD   There were no vitals taken for this visit. Physical Exam  Nursing note and vitals reviewed. Constitutional: She is oriented to person, place, and time. She appears well-developed and well-nourished.  Non-toxic appearance. She does not have a sickly appearance. She does not appear ill. No distress.  HENT:  Head: Normocephalic and atraumatic.  Neck:  Neck supple.  Pulmonary/Chest: Effort normal. No respiratory distress.  Neurological: She is alert and oriented to person, place, and time.  Skin: Skin is warm and dry. She is not diaphoretic.  Left thumb with 3 intact sutures. No surrounding erythema, no drainage wound is healing. No sign of dehiscence. Cap refill <2 seconds, normal sensation.  Psychiatric: She has a normal mood and affect. Her behavior is normal.    ED Course  Procedures (including critical care time) Labs Review Labs Reviewed - No data to display  Imaging Review No results found.   EKG Interpretation None      MDM   Final diagnoses:  Visit for suture removal   Patient presents for suture removal. Afebrile. No signs of  infection or mood sutures followup with PCP as needed. Td booster given.  I personally performed the services described in this documentation, which was scribed in my presence. The recorded information has been reviewed and is accurate.    Lorrine Kin, PA-C 01/21/14 1042

## 2014-01-21 NOTE — ED Notes (Signed)
She is here to have sutures removed from L thumb. She states the laceration is healing well and she has no complaints

## 2014-01-22 NOTE — ED Provider Notes (Signed)
Medical screening examination/treatment/procedure(s) were performed by non-physician practitioner and as supervising physician I was immediately available for consultation/collaboration.     Mirna Mires, MD 01/22/14 0900

## 2014-04-10 ENCOUNTER — Encounter (HOSPITAL_COMMUNITY): Payer: Self-pay | Admitting: Emergency Medicine

## 2014-04-10 ENCOUNTER — Emergency Department (HOSPITAL_COMMUNITY)
Admission: EM | Admit: 2014-04-10 | Discharge: 2014-04-10 | Disposition: A | Discharge status: Home / Self Care | Payer: Medicare HMO | Attending: Emergency Medicine | Admitting: Emergency Medicine

## 2014-04-10 DIAGNOSIS — G8929 Other chronic pain: Secondary | ICD-10-CM | POA: Insufficient documentation

## 2014-04-10 DIAGNOSIS — M545 Low back pain, unspecified: Secondary | ICD-10-CM

## 2014-04-10 DIAGNOSIS — Z7982 Long term (current) use of aspirin: Secondary | ICD-10-CM | POA: Diagnosis not present

## 2014-04-10 DIAGNOSIS — E119 Type 2 diabetes mellitus without complications: Secondary | ICD-10-CM | POA: Diagnosis not present

## 2014-04-10 DIAGNOSIS — M1990 Unspecified osteoarthritis, unspecified site: Secondary | ICD-10-CM | POA: Insufficient documentation

## 2014-04-10 DIAGNOSIS — Z79899 Other long term (current) drug therapy: Secondary | ICD-10-CM | POA: Diagnosis not present

## 2014-04-10 DIAGNOSIS — Z720 Tobacco use: Secondary | ICD-10-CM | POA: Insufficient documentation

## 2014-04-10 DIAGNOSIS — Z792 Long term (current) use of antibiotics: Secondary | ICD-10-CM | POA: Insufficient documentation

## 2014-04-10 DIAGNOSIS — I10 Essential (primary) hypertension: Secondary | ICD-10-CM | POA: Insufficient documentation

## 2014-04-10 DIAGNOSIS — M549 Dorsalgia, unspecified: Secondary | ICD-10-CM | POA: Diagnosis present

## 2014-04-10 MED ORDER — TRAMADOL HCL 50 MG PO TABS: 50.0000 mg | ORAL_TABLET | Freq: Four times a day (QID) | ORAL | Status: DC | PRN

## 2014-04-10 MED ORDER — TIZANIDINE HCL 2 MG PO TABS: 2.0000 mg | ORAL_TABLET | Freq: Three times a day (TID) | ORAL | Status: DC | PRN
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
## 2014-04-10 NOTE — Discharge Instructions (Signed)

## 2014-04-10 NOTE — ED Notes (Signed)
Pt arrives via POV with back pain radiaiting down right leg since Saturday. Pt denies recent injury. Reports burning pain. Pt ambulatory. Denies bowel/bladder incontinence. Vss.

## 2014-04-10 NOTE — ED Notes (Signed)
Pt reports sharp, burning pain to lower back on right side with radiation to right leg. Sensation and pulses intact to affected leg. Pt is ambulatory, but reports pain at night worse after walking all day.

## 2014-04-11 ENCOUNTER — Encounter (HOSPITAL_COMMUNITY): Payer: Self-pay | Admitting: Emergency Medicine

## 2014-04-11 ENCOUNTER — Emergency Department (HOSPITAL_COMMUNITY)
Admission: EM | Admit: 2014-04-11 | Discharge: 2014-04-12 | Disposition: A | Discharge status: Home / Self Care | Payer: Medicare HMO | Attending: Emergency Medicine | Admitting: Emergency Medicine

## 2014-04-11 DIAGNOSIS — E119 Type 2 diabetes mellitus without complications: Secondary | ICD-10-CM | POA: Diagnosis not present

## 2014-04-11 DIAGNOSIS — M1990 Unspecified osteoarthritis, unspecified site: Secondary | ICD-10-CM | POA: Insufficient documentation

## 2014-04-11 DIAGNOSIS — Z7982 Long term (current) use of aspirin: Secondary | ICD-10-CM | POA: Insufficient documentation

## 2014-04-11 DIAGNOSIS — Z79899 Other long term (current) drug therapy: Secondary | ICD-10-CM | POA: Diagnosis not present

## 2014-04-11 DIAGNOSIS — R112 Nausea with vomiting, unspecified: Secondary | ICD-10-CM | POA: Insufficient documentation

## 2014-04-11 DIAGNOSIS — R109 Unspecified abdominal pain: Secondary | ICD-10-CM | POA: Insufficient documentation

## 2014-04-11 DIAGNOSIS — I10 Essential (primary) hypertension: Secondary | ICD-10-CM | POA: Diagnosis not present

## 2014-04-11 DIAGNOSIS — Z9049 Acquired absence of other specified parts of digestive tract: Secondary | ICD-10-CM | POA: Insufficient documentation

## 2014-04-11 LAB — URINALYSIS, ROUTINE W REFLEX MICROSCOPIC
BILIRUBIN URINE: NEGATIVE
Glucose, UA: >1000 mg/dL — AB
Hgb urine dipstick: NEGATIVE
Ketones, ur: 15 mg/dL — AB
LEUKOCYTES UA: NEGATIVE
NITRITE: NEGATIVE
Protein, ur: NEGATIVE mg/dL
SPECIFIC GRAVITY, URINE: 1.029 (ref 1.005–1.030)
UROBILINOGEN UA: 0.2 mg/dL (ref 0.0–1.0)
pH: 5 (ref 5.0–8.0)

## 2014-04-11 LAB — CBC WITH DIFFERENTIAL/PLATELET
Basophils Absolute: 0 10*3/uL (ref 0.0–0.1)
Basophils Relative: 0 % (ref 0–1)
Eosinophils Absolute: 0 10*3/uL (ref 0.0–0.7)
Eosinophils Relative: 0 % (ref 0–5)
HCT: 34.7 % — ABNORMAL LOW (ref 36.0–46.0)
HEMOGLOBIN: 11.8 g/dL — AB (ref 12.0–15.0)
Lymphocytes Relative: 21 % (ref 12–46)
Lymphs Abs: 1.6 10*3/uL (ref 0.7–4.0)
MCH: 27.1 pg (ref 26.0–34.0)
MCHC: 34 g/dL (ref 30.0–36.0)
MCV: 79.6 fL (ref 78.0–100.0)
MONOS PCT: 3 % (ref 3–12)
Monocytes Absolute: 0.2 10*3/uL (ref 0.1–1.0)
Neutro Abs: 5.6 10*3/uL (ref 1.7–7.7)
Neutrophils Relative %: 76 % (ref 43–77)
Platelets: 309 10*3/uL (ref 150–400)
RBC: 4.36 MIL/uL (ref 3.87–5.11)
RDW: 14.1 % (ref 11.5–15.5)
WBC: 7.4 10*3/uL (ref 4.0–10.5)

## 2014-04-11 LAB — BASIC METABOLIC PANEL
Anion gap: 16 — ABNORMAL HIGH (ref 5–15)
BUN: 20 mg/dL (ref 6–23)
CALCIUM: 9.4 mg/dL (ref 8.4–10.5)
CO2: 22 mEq/L (ref 19–32)
CREATININE: 0.67 mg/dL (ref 0.50–1.10)
Chloride: 95 mEq/L — ABNORMAL LOW (ref 96–112)
GFR calc non Af Amer: >90 mL/min (ref >90)
Glucose, Bld: 194 mg/dL — ABNORMAL HIGH (ref 70–99)
Potassium: 4 mEq/L (ref 3.7–5.3)
Sodium: 133 mEq/L — ABNORMAL LOW (ref 137–147)

## 2014-04-11 LAB — URINE MICROSCOPIC-ADD ON

## 2014-04-11 LAB — CBG MONITORING, ED: Glucose-Capillary: 187 mg/dL — ABNORMAL HIGH (ref 70–99)

## 2014-04-11 MED ORDER — SODIUM CHLORIDE 0.9 % IV SOLN
Freq: Once | INTRAVENOUS | Status: AC
  Administered 2014-04-11: 22:00:00 via INTRAVENOUS

## 2014-04-11 MED ORDER — ONDANSETRON 4 MG PO TBDP
8.0000 mg | ORAL_TABLET | Freq: Once | ORAL | Status: DC
  Filled 2014-04-11: qty 2

## 2014-04-11 NOTE — ED Provider Notes (Signed)
CSN: [id number]     Arrival date & time 04/11/14  1802 History   First MD Initiated Contact with Patient 04/11/14 2138     Chief Complaint  Patient presents with  . Emesis     (Consider location/radiation/quality/duration/timing/severity/associated sxs/prior Treatment) HPI Comments: Patient seen last night for back pain radiating to leg given Tramadol and Zanaflex.  About 4 PM this afternoon developed nausea and vomiting.  Has not taken any medication for symptoms.  Reports blood sugar have been in normal range.  States only took 1 dose of medication and still having the same pain in back   Patient is a 58 y.o. female presenting with vomiting. The history is provided by the patient.  Emesis Severity:  Moderate Timing:  Intermittent Number of daily episodes:  Multiple Quality:  Bilious material and undigested food Progression:  Unchanged Chronicity:  New Recent urination:  Normal Relieved by:  None tried Worsened by:  Nothing tried Ineffective treatments:  None tried Associated symptoms: abdominal pain   Associated symptoms: no chills, no cough, no diarrhea, no fever, no myalgias, no sore throat and no URI   Risk factors: diabetes     Past Medical History  Diagnosis Date  . Arthritis   . Diabetes mellitus   . Hypertension    Past Surgical History  Procedure Laterality Date  . Hand surgery    . Carpal tunnel release    . Abdominal hysterectomy     History reviewed. No pertinent family history. History  Substance Use Topics  . Smoking status: Never Smoker   . Smokeless tobacco: Current User    Types: Chew  . Alcohol Use: No   OB History   Grav Para Term Preterm Abortions TAB SAB Ect Mult Living                 Review of Systems  Constitutional: Negative for chills.  HENT: Negative for sore throat.   Gastrointestinal: Positive for vomiting and abdominal pain. Negative for diarrhea.  Musculoskeletal: Negative for myalgias.      Allergies  Ciprofloxacin;  Contrast media; Levaquin; Ponstel; and Sulfa antibiotics  Home Medications   Prior to Admission medications   Medication Sig Start Date End Date Taking? Authorizing Provider  ALPRAZolam (XANAX) 0.25 MG tablet Take 0.25 mg by mouth 2 (two) times daily as needed for anxiety.    Yes Historical Provider, MD  aspirin 81 MG tablet Take 81 mg by mouth daily.   Yes Historical Provider, MD  Dapagliflozin Propanediol (FARXIGA) 5 MG TABS Take 5 mg by mouth daily.   Yes Historical Provider, MD  ezetimibe-simvastatin (VYTORIN) 10-20 MG per tablet Take 1 tablet by mouth every morning.   Yes Historical Provider, MD  glimepiride (AMARYL) 2 MG tablet Take 2 mg by mouth daily with breakfast.   Yes Historical Provider, MD  metFORMIN (GLUCOPHAGE) 1000 MG tablet Take 1,000 mg by mouth 2 (two) times daily with a meal.   Yes Historical Provider, MD  tiZANidine (ZANAFLEX) 2 MG tablet Take 1 tablet (2 mg total) by mouth every 8 (eight) hours as needed for muscle spasms. 04/10/14  Yes Domenic Moras, PA-C  traMADol (ULTRAM) 50 MG tablet Take 1 tablet (50 mg total) by mouth every 6 (six) hours as needed. 04/10/14  Yes Domenic Moras, PA-C   BP 140/80  Pulse 50  Temp(Src) 97.7 F (36.5 C) (Oral)  Resp 11  Ht 5\' 2"  (1.575 m)  Wt 216 lb (97.977 kg)  BMI 39.50 kg/m2  SpO2 99%  Physical Exam  ED Course  Procedures (including critical care time) Labs Review Labs Reviewed  CBC WITH DIFFERENTIAL - Abnormal; Notable for the following:    Hemoglobin 11.8 (*)    HCT 34.7 (*)    All other components within normal limits  BASIC METABOLIC PANEL - Abnormal; Notable for the following:    Sodium 133 (*)    Chloride 95 (*)    Glucose, Bld 194 (*)    Anion gap 16 (*)    All other components within normal limits  URINALYSIS, ROUTINE W REFLEX MICROSCOPIC - Abnormal; Notable for the following:    Glucose, UA >1000 (*)    Ketones, ur 15 (*)    All other components within normal limits  CBG MONITORING, ED - Abnormal; Notable for the  following:    Glucose-Capillary 187 (*)    All other components within normal limits  URINE MICROSCOPIC-ADD ON    Imaging Review No results found.   EKG Interpretation None     S. Islam or vomiting.  She is tolerating by mouth is to be discharged home.  Follow up with her primary care physician  MDM   Final diagnoses:  Non-intractable vomiting with nausea, vomiting of unspecified type  Flank pain         Garald Balding, NP 04/12/14 9741  Garald Balding, NP 04/12/14 6384  Garald Balding, NP 04/12/14 [phone]

## 2014-04-11 NOTE — ED Notes (Signed)
Pt having n/v at triage. Refuses all pain meds and zofran, only request is ice chips which she was given at triage.

## 2014-04-11 NOTE — ED Notes (Signed)
Pt reports being seen yesterday for severe right side back pain that radiates down right leg, hx of same. Pt was given pain medication and muscle relaxer but reports no relief and now having n/v. Denies any urinary symptoms.

## 2014-04-12 NOTE — ED Notes (Signed)
Discharge and follow up instructions reviewed with pt. Pt verbalized understanding.  

## 2014-04-12 NOTE — Discharge Instructions (Signed)

## 2014-04-12 NOTE — ED Notes (Signed)
Pt was given water and was able to keep it down with not episodes of vomiting.

## 2014-04-12 NOTE — ED Provider Notes (Signed)
Medical screening examination/treatment/procedure(s) were performed by non-physician practitioner and as supervising physician I was immediately available for consultation/collaboration.   EKG Interpretation None       Orlie Dakin, MD 04/12/14 1546

## 2014-04-16 ENCOUNTER — Emergency Department (HOSPITAL_COMMUNITY): Payer: Medicare HMO

## 2014-04-16 ENCOUNTER — Encounter (HOSPITAL_COMMUNITY): Payer: Self-pay | Admitting: Emergency Medicine

## 2014-04-16 ENCOUNTER — Emergency Department (HOSPITAL_COMMUNITY)
Admission: EM | Admit: 2014-04-16 | Discharge: 2014-04-16 | Disposition: A | Discharge status: Home / Self Care | Payer: Medicare HMO | Attending: Emergency Medicine | Admitting: Emergency Medicine

## 2014-04-16 DIAGNOSIS — I10 Essential (primary) hypertension: Secondary | ICD-10-CM | POA: Insufficient documentation

## 2014-04-16 DIAGNOSIS — K219 Gastro-esophageal reflux disease without esophagitis: Secondary | ICD-10-CM

## 2014-04-16 DIAGNOSIS — E119 Type 2 diabetes mellitus without complications: Secondary | ICD-10-CM | POA: Diagnosis not present

## 2014-04-16 DIAGNOSIS — R52 Pain, unspecified: Secondary | ICD-10-CM

## 2014-04-16 DIAGNOSIS — Z79899 Other long term (current) drug therapy: Secondary | ICD-10-CM | POA: Diagnosis not present

## 2014-04-16 DIAGNOSIS — Z7982 Long term (current) use of aspirin: Secondary | ICD-10-CM | POA: Diagnosis not present

## 2014-04-16 DIAGNOSIS — M545 Low back pain: Secondary | ICD-10-CM | POA: Insufficient documentation

## 2014-04-16 DIAGNOSIS — M4306 Spondylolysis, lumbar region: Secondary | ICD-10-CM | POA: Diagnosis not present

## 2014-04-16 DIAGNOSIS — M47816 Spondylosis without myelopathy or radiculopathy, lumbar region: Secondary | ICD-10-CM

## 2014-04-16 DIAGNOSIS — M5441 Lumbago with sciatica, right side: Secondary | ICD-10-CM

## 2014-04-16 LAB — BASIC METABOLIC PANEL
Anion gap: 17 — ABNORMAL HIGH (ref 5–15)
BUN: 11 mg/dL (ref 6–23)
CO2: 21 mEq/L (ref 19–32)
Calcium: 9.8 mg/dL (ref 8.4–10.5)
Chloride: 98 mEq/L (ref 96–112)
Creatinine, Ser: 0.77 mg/dL (ref 0.50–1.10)
GFR calc Af Amer: >90 mL/min (ref >90)
GFR calc non Af Amer: >90 mL/min (ref >90)
Glucose, Bld: 232 mg/dL — ABNORMAL HIGH (ref 70–99)
Potassium: 3.1 mEq/L — ABNORMAL LOW (ref 3.7–5.3)
Sodium: 136 mEq/L — ABNORMAL LOW (ref 137–147)

## 2014-04-16 LAB — URINE MICROSCOPIC-ADD ON

## 2014-04-16 LAB — CBG MONITORING, ED: Glucose-Capillary: 300 mg/dL — ABNORMAL HIGH (ref 70–99)

## 2014-04-16 LAB — URINALYSIS, ROUTINE W REFLEX MICROSCOPIC
Bilirubin Urine: NEGATIVE
Glucose, UA: >1000 mg/dL — AB
Hgb urine dipstick: NEGATIVE
Ketones, ur: 15 mg/dL — AB
Leukocytes, UA: NEGATIVE
Nitrite: NEGATIVE
Protein, ur: NEGATIVE mg/dL
Specific Gravity, Urine: 1.03 (ref 1.005–1.030)
Urobilinogen, UA: 0.2 mg/dL (ref 0.0–1.0)
pH: 6.5 (ref 5.0–8.0)

## 2014-04-16 LAB — CBC
HCT: 34 % — ABNORMAL LOW (ref 36.0–46.0)
Hemoglobin: 11.6 g/dL — ABNORMAL LOW (ref 12.0–15.0)
MCH: 27.2 pg (ref 26.0–34.0)
MCHC: 34.1 g/dL (ref 30.0–36.0)
MCV: 79.6 fL (ref 78.0–100.0)
Platelets: 382 10*3/uL (ref 150–400)
RBC: 4.27 MIL/uL (ref 3.87–5.11)
RDW: 14 % (ref 11.5–15.5)
WBC: 11 10*3/uL — ABNORMAL HIGH (ref 4.0–10.5)

## 2014-04-16 LAB — LIPASE, BLOOD: Lipase: 18 U/L (ref 11–59)

## 2014-04-16 MED ORDER — TRAMADOL HCL 50 MG PO TABS: 50.0000 mg | ORAL_TABLET | Freq: Four times a day (QID) | ORAL | Status: DC | PRN

## 2014-04-16 MED ORDER — ONDANSETRON HCL 4 MG/2ML IJ SOLN
4.0000 mg | Freq: Once | INTRAMUSCULAR | Status: AC
  Administered 2014-04-16: 4 mg via INTRAVENOUS
  Filled 2014-04-16: qty 2

## 2014-04-16 MED ORDER — ONDANSETRON HCL 4 MG PO TABS: 4.0000 mg | ORAL_TABLET | Freq: Four times a day (QID) | ORAL | Status: DC

## 2014-04-16 MED ORDER — LORAZEPAM 2 MG/ML IJ SOLN
1.0000 mg | Freq: Once | INTRAMUSCULAR | Status: AC
  Administered 2014-04-16: 1 mg via INTRAVENOUS
  Filled 2014-04-16: qty 1

## 2014-04-16 MED ORDER — GI COCKTAIL ~~LOC~~
30.0000 mL | Freq: Once | ORAL | Status: AC
  Administered 2014-04-16: 30 mL via ORAL
  Filled 2014-04-16: qty 30

## 2014-04-16 MED ORDER — GADOBENATE DIMEGLUMINE 529 MG/ML IV SOLN
20.0000 mL | Freq: Once | INTRAVENOUS | Status: AC | PRN
  Administered 2014-04-16: 20 mL via INTRAVENOUS

## 2014-04-16 MED ORDER — MORPHINE SULFATE 4 MG/ML IJ SOLN
4.0000 mg | Freq: Once | INTRAMUSCULAR | Status: AC
  Administered 2014-04-16: 4 mg via INTRAVENOUS
  Filled 2014-04-16: qty 1

## 2014-04-16 NOTE — ED Provider Notes (Signed)
Medical screening examination/treatment/procedure(s) were performed by non-physician practitioner and as supervising physician I was immediately available for consultation/collaboration.    Dot Lanes, MD 04/16/14 [phone]

## 2014-04-16 NOTE — ED Notes (Signed)
Patient transported to MRI 

## 2014-04-16 NOTE — ED Provider Notes (Signed)
CSN: [id number]     Arrival date & time 04/16/14  1029 History   First MD Initiated Contact with Patient 04/16/14 1048     Chief Complaint  Patient presents with  . Back Pain     (Consider location/radiation/quality/duration/timing/severity/associated sxs/prior Treatment) HPI Pt is a 58yo morbidly obese female presenting to ED from home via EMS with reports of worsening Right lower back pain radiating down right leg. Pt states pain has gradually worsened over last 2 weeks but even worse this morning to the point she has had difficulty ambulating.  Pain in her back is 10/10 aching and sharp. Worse with movement and while in certain positions. Difficult for pt to get comfortable. Pt states she was seen by her PCP and has been given pain medications but they do not help. States she was referred to pain management and was suppose to get imaging yesterday but pain was too bad to get out of her house. Pt also c/o intermittent nausea and vomiting, last episode of emesis earlier 4-5 days ago.  Pt c/o diffuse abdominal pain and burning.  States she feels a "grumbling" sensation in her abdomen. Denies change in bowel or bladder habits. Denies numbness in groin or legs. Denies hx of cancer or IVDU. Denies recent fall or other trauma to her back.   Past Medical History  Diagnosis Date  . Arthritis   . Diabetes mellitus   . Hypertension    Past Surgical History  Procedure Laterality Date  . Hand surgery    . Carpal tunnel release    . Abdominal hysterectomy     No family history on file. History  Substance Use Topics  . Smoking status: Never Smoker   . Smokeless tobacco: Current User    Types: Chew  . Alcohol Use: No   OB History   Grav Para Term Preterm Abortions TAB SAB Ect Mult Living                 Review of Systems  Constitutional: Negative for fever and chills.  Respiratory: Negative for cough and shortness of breath.   Cardiovascular: Negative for chest pain and palpitations.   Gastrointestinal: Positive for nausea, vomiting and abdominal pain. Negative for diarrhea.  Genitourinary: Positive for flank pain ( right). Negative for dysuria, urgency, frequency, hematuria and pelvic pain.  Musculoskeletal: Positive for arthralgias (right hip), back pain, gait problem and myalgias. Negative for neck pain and neck stiffness.  Skin: Negative for color change and wound.  All other systems reviewed and are negative.     Allergies  Levaquin; Other; Sulfa antibiotics; Yellow dyes (non-tartrazine); Ciprofloxacin; Contrast media; and Ponstel  Home Medications   Prior to Admission medications   Medication Sig Start Date End Date Taking? Authorizing Provider  ALPRAZolam (XANAX) 0.25 MG tablet Take 0.25 mg by mouth 2 (two) times daily as needed for anxiety.    Yes Historical Provider, MD  aspirin 81 MG tablet Take 81 mg by mouth daily.   Yes Historical Provider, MD  Dapagliflozin Propanediol (FARXIGA) 5 MG TABS Take 5 mg by mouth daily.   Yes Historical Provider, MD  ezetimibe-simvastatin (VYTORIN) 10-20 MG per tablet Take 1 tablet by mouth every morning.   Yes Historical Provider, MD  glimepiride (AMARYL) 2 MG tablet Take 2 mg by mouth daily with breakfast.   Yes Historical Provider, MD  metFORMIN (GLUCOPHAGE) 1000 MG tablet Take 1,000 mg by mouth 2 (two) times daily with a meal.   Yes Historical Provider, MD  pneumococcal 13-valent conjugate vaccine (PREVNAR 13) SUSP injection Inject 0.5 mLs into the muscle once.   Yes Historical Provider, MD  tiZANidine (ZANAFLEX) 2 MG tablet Take 1 tablet (2 mg total) by mouth every 8 (eight) hours as needed for muscle spasms. 04/10/14  Yes Domenic Moras, PA-C  ondansetron (ZOFRAN) 4 MG tablet Take 1 tablet (4 mg total) by mouth every 6 (six) hours. 04/16/14   Noland Fordyce, PA-C  traMADol (ULTRAM) 50 MG tablet Take 1 tablet (50 mg total) by mouth every 6 (six) hours as needed. 04/16/14   Noland Fordyce, PA-C   BP 109/68  Pulse 73  Temp(Src)  98.4 F (36.9 C) (Oral)  Resp 16  SpO2 100% Physical Exam  Nursing note and vitals reviewed. Constitutional: She appears well-developed and well-nourished. No distress.  Morbidly obese female sitting in exam chair, appears uncomfortable and fatigued.  HENT:  Head: Normocephalic and atraumatic.  Eyes: Conjunctivae are normal. No scleral icterus.  Neck: Normal range of motion.  Cardiovascular: Normal rate, regular rhythm and normal heart sounds.   Pulmonary/Chest: Effort normal and breath sounds normal. No respiratory distress. She has no wheezes. She has no rales. She exhibits no tenderness.  Abdominal: Soft. Bowel sounds are normal. She exhibits no distension and no mass. There is tenderness. There is no rebound and no guarding.  Obese abdomen, diffuse tenderness.  Musculoskeletal: Normal range of motion.  Neurological: She is alert.  Antalgic gait, uses cane for assistance.   Skin: Skin is warm and dry. She is not diaphoretic.    ED Course  Procedures (including critical care time) Labs Review Labs Reviewed  URINALYSIS, ROUTINE W REFLEX MICROSCOPIC - Abnormal; Notable for the following:    APPearance CLOUDY (*)    Glucose, UA >1000 (*)    Ketones, ur 15 (*)    All other components within normal limits  CBC - Abnormal; Notable for the following:    WBC 11.0 (*)    Hemoglobin 11.6 (*)    HCT 34.0 (*)    All other components within normal limits  BASIC METABOLIC PANEL - Abnormal; Notable for the following:    Sodium 136 (*)    Potassium 3.1 (*)    Glucose, Bld 232 (*)    Anion gap 17 (*)    All other components within normal limits  CBG MONITORING, ED - Abnormal; Notable for the following:    Glucose-Capillary 300 (*)    All other components within normal limits  LIPASE, BLOOD  URINE MICROSCOPIC-ADD ON    Imaging Review Mr Lumbar Spine W Contrast  04/16/2014   CLINICAL DATA:  Chronic low back pain and bilateral extremity pain. Woke up 2 days ago unable to bear  weight. Pain aggravated by walking.  EXAM: MRI LUMBAR SPINE WITH CONTRAST  CONTRAST:  53mL MULTIHANCE GADOBENATE DIMEGLUMINE 529 MG/ML IV SOLN  COMPARISON:  Radiography 06/21/2005  FINDINGS: There is no abnormality at L2-3 or above. The discs are normal. The canal and foramina are widely patent. The distal cord and conus are normal with conus tip at L1-2.  L3-4:  No disc pathology.  Minimal facet hypertrophy.  No stenosis.  L4-5: There is advanced bilateral facet arthropathy with edema. This allows anterolisthesis of 3 mm. This would likely worsen with standing or flexion. The disc bulges mildly. There is mild stenosis of the lateral recesses and foramina. This would likely worsen with standing or flexion.  L5-S1: Mild bulging of the disc in the left extra foraminal region. Bilateral facet  arthropathy without slippage  IMPRESSION: The dominant finding in this case is that of advanced bilateral facet arthropathy at L4-5. The joints are edematous and contain fluid. There is 3 mm of anterolisthesis that would likely worsen with standing or flexion. The disc bulges diffusely. Stenosis of the lateral recesses and foramina would likely worsen with standing or flexion.  Minimal facet arthropathy at L3-4 without slippage.  Moderate facet arthropathy at L5-S1 without slippage.   Electronically Signed   By: Nelson Chimes M.D.   On: 04/16/2014 15:56     EKG Interpretation None      MDM   Final diagnoses:  Pain aggravated by walking  Bilateral low back pain with right-sided sciatica  Lumbar arthropathy  Gastroesophageal reflux disease, esophagitis presence not specified    Pt c/o right lower back pain radiating down right leg. Worsening pain with ambulation. Reports being scheduled for Pain Management first visit on 04/23/14, as referred to by her PCP.  Denies new injuries.  Discussed pt with Dr. Audie Pinto who also examined pt. Due to reports of increased difficulty walking, MRI lumbar spine ordered.  Pt also c/o  abdominal pain and burning. Labs: unremarkable. GI cocktail given which did help with abdominal pain.  Not concerned for surgical abdomen.  MRI: findings indicate lumbar arthropathy.    4:32PM Consulted with Dr. Ronnald Ramp, neurosurgery, who states pt would benefit from spinal surgery. Advised pt call office to f/u outpatient. Pt may continue to take her oxycodone as prescribed by her PCP for pain. Rx: tramadol.  Return precautions provided. Pt verbalized understanding and agreement with tx plan.     Noland Fordyce, PA-C 04/16/14 1736

## 2014-04-16 NOTE — ED Notes (Signed)
Per EMS: Pt c/o rt lumbar back pain w/ radiation down rt leg x 2 wks.

## 2014-04-16 NOTE — ED Provider Notes (Signed)
Pt initially came in via EMS with c/o right lower back pain radiating into right leg.  Pt appears drowsy, CBG 300. During hx, pt also c/o her abdomen burning and episodes of vomiting several days ago.  States now her back is so sore she cannot ambulate w/o assistance.  Denies new injuries.  With severe back pain and c/o burning in abdomen, pt would benefit from labs and possible imaging.  Pt moved back to acute care section.   MSE was initiated and I personally evaluated the patient at  11:26 AM on April 16, 2014.  The patient appears stable so that the remainder of the MSE may be completed by another provider.  Noland Fordyce, PA-C 04/16/14 1231

## 2014-04-18 NOTE — ED Provider Notes (Signed)
Medical screening examination/treatment/procedure(s) were performed by non-physician practitioner and as supervising physician I was immediately available for consultation/collaboration.   EKG Interpretation None        Tanna Furry, MD 04/18/14 [phone]

## 2014-04-24 NOTE — ED Provider Notes (Signed)
Medical screening examination/treatment/procedure(s) were performed by non-physician practitioner and as supervising physician I was immediately available for consultation/collaboration.   Dot Lanes, MD 04/24/14 2117

## 2014-10-09 ENCOUNTER — Emergency Department (HOSPITAL_COMMUNITY)
Admission: EM | Admit: 2014-10-09 | Discharge: 2014-10-09 | Disposition: A | Discharge status: Home / Self Care | Payer: Commercial Managed Care - HMO | Attending: Emergency Medicine | Admitting: Emergency Medicine

## 2014-10-09 ENCOUNTER — Encounter (HOSPITAL_COMMUNITY): Payer: Self-pay | Admitting: Emergency Medicine

## 2014-10-09 DIAGNOSIS — M1990 Unspecified osteoarthritis, unspecified site: Secondary | ICD-10-CM | POA: Insufficient documentation

## 2014-10-09 DIAGNOSIS — M79604 Pain in right leg: Secondary | ICD-10-CM | POA: Diagnosis not present

## 2014-10-09 DIAGNOSIS — Z7982 Long term (current) use of aspirin: Secondary | ICD-10-CM | POA: Diagnosis not present

## 2014-10-09 DIAGNOSIS — M79605 Pain in left leg: Secondary | ICD-10-CM | POA: Diagnosis not present

## 2014-10-09 DIAGNOSIS — Z79899 Other long term (current) drug therapy: Secondary | ICD-10-CM | POA: Diagnosis not present

## 2014-10-09 DIAGNOSIS — E119 Type 2 diabetes mellitus without complications: Secondary | ICD-10-CM | POA: Diagnosis not present

## 2014-10-09 DIAGNOSIS — I10 Essential (primary) hypertension: Secondary | ICD-10-CM | POA: Diagnosis not present

## 2014-10-09 DIAGNOSIS — M79609 Pain in unspecified limb: Secondary | ICD-10-CM | POA: Diagnosis not present

## 2014-10-09 DIAGNOSIS — R252 Cramp and spasm: Secondary | ICD-10-CM

## 2014-10-09 NOTE — ED Notes (Signed)
Patient states bilateral leg pain "I have charlie horses all the time.   It goes down to my toes and they cramp up".   Patient denies injury and other symptoms.

## 2014-10-09 NOTE — ED Provider Notes (Signed)
wCSN: [id number]     Arrival date & time 10/09/14  1152 History  This chart was scribed for non-physician practitioner, Noland Fordyce, PA-C, working with Ripley Fraise, MD by Ladene Artist, ED Scribe. This patient was seen in room TR07C/TR07C and the patient's care was started at 12:25 PM.   Chief Complaint  Patient presents with  . Leg Pain  . Toe Pain   The history is provided by the patient. No language interpreter was used.   HPI Comments: Sarah Webster is a 59 y.o. female, with a h/o DM, HTN, arthritis, who presents to the Emergency Department complaining of persistent bilateral leg pain for the past 2 weeks. Pt describes pain as a cramping sensation that radiates from her legs to her toes. She currently rates her pain as 3/10 during the day and 10/10 at night. No personal h/o DVT but pt's daughter has blood clots and there is question of genetic component. Pt denies fall, injury, redness, warmth. Pt states that she has expressed these concerns to her PCP who suggested relaxation. She has been treating with ice, ibuprofen and Vicodin that she is prescribed for sciatica.        Past Medical History  Diagnosis Date  . Arthritis   . Diabetes mellitus   . Hypertension    Past Surgical History  Procedure Laterality Date  . Hand surgery    . Carpal tunnel release    . Abdominal hysterectomy     No family history on file. History  Substance Use Topics  . Smoking status: Never Smoker   . Smokeless tobacco: Current User    Types: Chew  . Alcohol Use: No   OB History    No data available     Review of Systems  Musculoskeletal: Positive for myalgias.  Skin: Negative for color change.   Allergies  Levaquin; Other; Sulfa antibiotics; Yellow dyes (non-tartrazine); Ciprofloxacin; Contrast media; and Ponstel  Home Medications   Prior to Admission medications   Medication Sig Start Date End Date Taking? Authorizing Provider  ALPRAZolam (XANAX) 0.25 MG tablet Take 0.25 mg by mouth  2 (two) times daily as needed for anxiety.     Historical Provider, MD  aspirin 81 MG tablet Take 81 mg by mouth daily.    Historical Provider, MD  Dapagliflozin Propanediol (FARXIGA) 5 MG TABS Take 5 mg by mouth daily.    Historical Provider, MD  ezetimibe-simvastatin (VYTORIN) 10-20 MG per tablet Take 1 tablet by mouth every morning.    Historical Provider, MD  glimepiride (AMARYL) 2 MG tablet Take 2 mg by mouth daily with breakfast.    Historical Provider, MD  metFORMIN (GLUCOPHAGE) 1000 MG tablet Take 1,000 mg by mouth 2 (two) times daily with a meal.    Historical Provider, MD  ondansetron (ZOFRAN) 4 MG tablet Take 1 tablet (4 mg total) by mouth every 6 (six) hours. 04/16/14   Noland Fordyce, PA-C  pneumococcal 13-valent conjugate vaccine (PREVNAR 13) SUSP injection Inject 0.5 mLs into the muscle once.    Historical Provider, MD  tiZANidine (ZANAFLEX) 2 MG tablet Take 1 tablet (2 mg total) by mouth every 8 (eight) hours as needed for muscle spasms. 04/10/14   Domenic Moras, PA-C  traMADol (ULTRAM) 50 MG tablet Take 1 tablet (50 mg total) by mouth every 6 (six) hours as needed. 04/16/14   Noland Fordyce, PA-C   BP 99/79 mmHg  Pulse 88  Temp(Src) 97.8 F (36.6 C) (Oral)  Resp 20  Ht 5\' 1"  (  1.549 m)  Wt 211 lb (95.709 kg)  BMI 39.89 kg/m2  SpO2 99% Physical Exam  Constitutional: She is oriented to person, place, and time. She appears well-developed and well-nourished.  HENT:  Head: Normocephalic and atraumatic.  Eyes: EOM are normal.  Neck: Normal range of motion.  Cardiovascular: Normal rate.   Pulses:      Dorsalis pedis pulses are 2+ on the right side, and 2+ on the left side.       Posterior tibial pulses are 2+ on the right side, and 2+ on the left side.  Pulmonary/Chest: Effort normal.  Musculoskeletal: Normal range of motion.  Full ROM of knees and ankles with no obvious deformity. varicose veins present in bilateral calves. Mild tenderness to bilateral calves. No erythema and  warmth.   Neurological: She is alert and oriented to person, place, and time.  Skin: Skin is warm and dry.  Psychiatric: She has a normal mood and affect. Her behavior is normal.  Nursing note and vitals reviewed.  ED Course  Procedures (including critical care time) DIAGNOSTIC STUDIES: Oxygen Saturation is 99% on RA, normal by my interpretation.    COORDINATION OF CARE: 12:29 PM-Discussed treatment plan which includes [place] and continue ibuprofen with pt at bedside and pt agreed to plan.   Labs Review Labs Reviewed - No data to display  Imaging Review No results found.   EKG Interpretation None      MDM   Final diagnoses:  Bilateral leg cramps    Pt is a 59yo female presenting to ED with c/o intermittent bilateral leg cramping and is concerned for blood clots due to daughter having clots.  No erythema or warmth on exam. Bilateral calf tenderness. Pulses 2+ bilaterally. Venous doppler:  Negative for DVT. Pt has vicodin at home, advised to f/u with PCP for further evaluation and management of recurrent leg cramps, may need f/u with PT or additional studies to r/o electrolyte imabalance or varicose veins. Home care instructions provided. Pt verbalized understanding and agreement with tx plan.   I personally performed the services described in this documentation, which was scribed in my presence. The recorded information has been reviewed and is accurate.   Noland Fordyce, PA-C 10/09/14 1558  Ripley Fraise, MD 10/09/14 [phone]

## 2014-10-09 NOTE — Progress Notes (Signed)
VASCULAR LAB PRELIMINARY  PRELIMINARY  PRELIMINARY  PRELIMINARY  BLEV completed.    Preliminary report:  Negative DVT bilaterally.   No Baker's Cyst(s) bilaterally.  August Albino, RVT 10/09/2014, 3:29 PM

## 2014-10-14 ENCOUNTER — Other Ambulatory Visit: Payer: Self-pay

## 2014-10-14 DIAGNOSIS — Z1231 Encounter for screening mammogram for malignant neoplasm of breast: Secondary | ICD-10-CM

## 2014-12-22 ENCOUNTER — Ambulatory Visit: Payer: Commercial Managed Care - HMO

## 2014-12-22 ENCOUNTER — Ambulatory Visit
Admission: RE | Admit: 2014-12-22 | Discharge: 2014-12-22 | Disposition: A | Discharge status: Home / Self Care | Payer: Commercial Managed Care - HMO | Source: Ambulatory Visit

## 2014-12-22 DIAGNOSIS — Z1231 Encounter for screening mammogram for malignant neoplasm of breast: Secondary | ICD-10-CM

## 2015-01-14 ENCOUNTER — Emergency Department (HOSPITAL_COMMUNITY)
Admission: EM | Admit: 2015-01-14 | Discharge: 2015-01-15 | Disposition: A | Discharge status: Home / Self Care | Payer: Commercial Managed Care - HMO | Attending: Emergency Medicine | Admitting: Emergency Medicine

## 2015-01-14 ENCOUNTER — Encounter (HOSPITAL_COMMUNITY): Payer: Self-pay | Admitting: Emergency Medicine

## 2015-01-14 DIAGNOSIS — E119 Type 2 diabetes mellitus without complications: Secondary | ICD-10-CM | POA: Insufficient documentation

## 2015-01-14 DIAGNOSIS — Z7982 Long term (current) use of aspirin: Secondary | ICD-10-CM | POA: Insufficient documentation

## 2015-01-14 DIAGNOSIS — M1990 Unspecified osteoarthritis, unspecified site: Secondary | ICD-10-CM | POA: Diagnosis not present

## 2015-01-14 DIAGNOSIS — I10 Essential (primary) hypertension: Secondary | ICD-10-CM | POA: Insufficient documentation

## 2015-01-14 DIAGNOSIS — Z79899 Other long term (current) drug therapy: Secondary | ICD-10-CM | POA: Diagnosis not present

## 2015-01-14 DIAGNOSIS — R300 Dysuria: Secondary | ICD-10-CM

## 2015-01-14 DIAGNOSIS — B373 Candidiasis of vulva and vagina: Secondary | ICD-10-CM

## 2015-01-14 DIAGNOSIS — B3731 Acute candidiasis of vulva and vagina: Secondary | ICD-10-CM

## 2015-01-14 DIAGNOSIS — N898 Other specified noninflammatory disorders of vagina: Secondary | ICD-10-CM | POA: Diagnosis present

## 2015-01-14 LAB — BASIC METABOLIC PANEL
ANION GAP: 10 (ref 5–15)
BUN: 17 mg/dL (ref 6–20)
CO2: 24 mmol/L (ref 22–32)
CREATININE: 0.99 mg/dL (ref 0.44–1.00)
Calcium: 9.3 mg/dL (ref 8.9–10.3)
Chloride: 97 mmol/L — ABNORMAL LOW (ref 101–111)
Glucose, Bld: 324 mg/dL — ABNORMAL HIGH (ref 65–99)
Potassium: 4.3 mmol/L (ref 3.5–5.1)
Sodium: 131 mmol/L — ABNORMAL LOW (ref 135–145)

## 2015-01-14 LAB — CBG MONITORING, ED
GLUCOSE-CAPILLARY: 318 mg/dL — AB (ref 65–99)
Glucose-Capillary: 251 mg/dL — ABNORMAL HIGH (ref 65–99)

## 2015-01-14 LAB — URINALYSIS, ROUTINE W REFLEX MICROSCOPIC
Bilirubin Urine: NEGATIVE
Glucose, UA: >1000 mg/dL — AB
Hgb urine dipstick: NEGATIVE
Ketones, ur: NEGATIVE mg/dL
Leukocytes, UA: NEGATIVE
NITRITE: NEGATIVE
PH: 5 (ref 5.0–8.0)
Protein, ur: NEGATIVE mg/dL
Specific Gravity, Urine: 1.014 (ref 1.005–1.030)
UROBILINOGEN UA: 0.2 mg/dL (ref 0.0–1.0)

## 2015-01-14 LAB — URINE MICROSCOPIC-ADD ON

## 2015-01-14 LAB — WET PREP, GENITAL
Trich, Wet Prep: NONE SEEN
YEAST WET PREP: NONE SEEN

## 2015-01-14 MED ORDER — FLUCONAZOLE 100 MG PO TABS
150.0000 mg | ORAL_TABLET | Freq: Once | ORAL | Status: AC
  Administered 2015-01-14: 150 mg via ORAL
  Filled 2015-01-14: qty 2

## 2015-01-14 NOTE — ED Notes (Signed)
Pt CBG, 318. Nurse was notified.

## 2015-01-14 NOTE — ED Provider Notes (Signed)
CSN: [id number]     Arrival date & time 01/14/15  1925 History   This chart was scribed for non-physician practitioner working with Milton Ferguson, MD by Forrestine Him, ED Scribe. This patient was seen in room TR03C/TR03C and the patient's care was started at 10:09 PM.   Chief Complaint  Patient presents with  . Vaginitis   The history is provided by the patient and medical records. No language interpreter was used.    HPI Comments: Sarah Webster is a 59 y.o. female with a PMHx of HTN and DM who presents to the Emergency Department complaining of constant, ongoing, unchanged vaginal discharge x 2 weeks. There is also associated vaginal internal/external itching and mild dysuria. Pt denies any follow up with her PCP for this concern. At home remedies attempted including bathing with no relief. No recent fever or chills. She is not currently sexually active. Blood sugurs typically run from 95-130. Metformin 1000 mg taken daily as directed.  She reports she believes she has a yeast infection she has had these previously. She reports a recent weight loss of 195 pounds.  Past Medical History  Diagnosis Date  . Arthritis   . Diabetes mellitus   . Hypertension    Past Surgical History  Procedure Laterality Date  . Hand surgery    . Carpal tunnel release    . Abdominal hysterectomy     No family history on file. History  Substance Use Topics  . Smoking status: Never Smoker   . Smokeless tobacco: Current User    Types: Chew  . Alcohol Use: No   OB History    No data available     Review of Systems  Constitutional: Negative for fever, chills, diaphoresis, appetite change, fatigue and unexpected weight change.  HENT: Negative for mouth sores.   Eyes: Negative for visual disturbance.  Respiratory: Negative for cough, chest tightness, shortness of breath and wheezing.   Cardiovascular: Negative for chest pain.  Gastrointestinal: Negative for nausea, vomiting, abdominal pain, diarrhea and  constipation.  Endocrine: Negative for polydipsia, polyphagia and polyuria.  Genitourinary: Positive for dysuria and vaginal discharge. Negative for urgency, frequency, hematuria, difficulty urinating and vaginal pain.       Vaginal itching  Musculoskeletal: Negative for back pain and neck stiffness.  Skin: Negative for rash.  Allergic/Immunologic: Negative for immunocompromised state.  Neurological: Negative for syncope, light-headedness and headaches.  Hematological: Does not bruise/bleed easily.  Psychiatric/Behavioral: Negative for confusion and sleep disturbance. The patient is not nervous/anxious.       Allergies  Levaquin; Other; Sulfa antibiotics; Yellow dyes (non-tartrazine); Ciprofloxacin; Contrast media; and Ponstel  Home Medications   Prior to Admission medications   Medication Sig Start Date End Date Taking? Authorizing Provider  ALPRAZolam (XANAX) 0.25 MG tablet Take 0.25 mg by mouth 2 (two) times daily as needed for anxiety.    Yes Historical Provider, MD  aspirin 81 MG tablet Take 81 mg by mouth daily.   Yes Historical Provider, MD  Dapagliflozin Propanediol (FARXIGA) 5 MG TABS Take 5 mg by mouth daily.   Yes Historical Provider, MD  ezetimibe-simvastatin (VYTORIN) 10-20 MG per tablet Take 1 tablet by mouth every morning.   Yes Historical Provider, MD  glimepiride (AMARYL) 2 MG tablet Take 2 mg by mouth daily with breakfast.   Yes Historical Provider, MD  HYDROcodone-acetaminophen (NORCO/VICODIN) 5-325 MG per tablet Take 1 tablet by mouth 2 (two) times daily as needed for moderate pain.  12/19/14  Yes Historical Provider, MD  ibuprofen (ADVIL,MOTRIN) 800 MG tablet Take 800 mg by mouth 2 (two) times daily as needed for mild pain.  12/26/14  Yes Historical Provider, MD  metFORMIN (GLUCOPHAGE) 1000 MG tablet Take 1,000 mg by mouth 2 (two) times daily with a meal.   Yes Historical Provider, MD  omeprazole (PRILOSEC) 20 MG capsule Take 20 mg by mouth 2 (two) times daily. 12/06/14   Yes Historical Provider, MD  valsartan-hydrochlorothiazide (DIOVAN-HCT) 160-12.5 MG per tablet Take 1 tablet by mouth daily. 12/05/14  Yes Historical Provider, MD  fluconazole (DIFLUCAN) 150 MG tablet Take 1 tablet (150 mg total) by mouth once. In 72 hours if symptoms persist, repeat 172 hours after that if symptoms persist 01/15/15   Jarrett Soho Suzanne Kho, PA-C  nystatin cream (MYCOSTATIN) Apply to affected area 2 times daily 01/15/15   Jarrett Soho Mishka Stegemann, PA-C   Triage Vitals: BP 145/78 mmHg  Pulse 67  Temp(Src) 98.3 F (36.8 C) (Oral)  Resp 20  SpO2 100%   Physical Exam  Constitutional: She appears well-developed and well-nourished. No distress.  HENT:  Head: Normocephalic and atraumatic.  Eyes: Conjunctivae are normal.  Neck: Normal range of motion.  Cardiovascular: Normal rate, regular rhythm, normal heart sounds and intact distal pulses.   No murmur heard. Pulmonary/Chest: Effort normal and breath sounds normal. No respiratory distress. She has no wheezes.  Abdominal: Soft. Bowel sounds are normal. There is no tenderness. There is no rebound and no guarding. Hernia confirmed negative in the right inguinal area and confirmed negative in the left inguinal area.  Genitourinary: Uterus normal. No labial fusion. There is rash on the right labia. There is no tenderness or lesion on the right labia. There is rash on the left labia. There is no tenderness or lesion on the left labia. Uterus is not deviated, not enlarged, not fixed and not tender. Cervix exhibits no motion tenderness, no discharge and no friability. Right adnexum displays no mass, no tenderness and no fullness. Left adnexum displays no mass, no tenderness and no fullness. No erythema, tenderness or bleeding in the vagina. No foreign body around the vagina. No signs of injury around the vagina. Vaginal discharge ( thick, white, minimal) found.  Beefy red rash to the labia majora and minor were bilaterally with several satellite  lesions.  Excoriations noted No increased warmth, induration, open lesions or evidence of secondary infection  Musculoskeletal: Normal range of motion. She exhibits no edema.  Lymphadenopathy:       Right: No inguinal adenopathy present.       Left: No inguinal adenopathy present.  Neurological: She is alert.  Skin: Skin is warm and dry. She is not diaphoretic. No erythema.  Psychiatric: She has a normal mood and affect.  Nursing note and vitals reviewed.   ED Course  Procedures (including critical care time)  DIAGNOSTIC STUDIES: Oxygen Saturation is 100% on RA, Normal by my interpretation.    COORDINATION OF CARE: 10:14 PM-Discussed treatment plan with pt at bedside and pt agreed to plan.     Labs Review Labs Reviewed  WET PREP, GENITAL - Abnormal; Notable for the following:    Clue Cells Wet Prep HPF POC MODERATE (*)    WBC, Wet Prep HPF POC FEW (*)    All other components within normal limits  URINALYSIS, ROUTINE W REFLEX MICROSCOPIC (NOT AT [facility]) - Abnormal; Notable for the following:    APPearance CLOUDY (*)    Glucose, UA >1000 (*)    All other components within normal limits  BASIC  METABOLIC PANEL - Abnormal; Notable for the following:    Sodium 131 (*)    Chloride 97 (*)    Glucose, Bld 324 (*)    All other components within normal limits  URINE MICROSCOPIC-ADD ON - Abnormal; Notable for the following:    Squamous Epithelial / LPF FEW (*)    All other components within normal limits  CBG MONITORING, ED - Abnormal; Notable for the following:    Glucose-Capillary 251 (*)    All other components within normal limits  CBG MONITORING, ED - Abnormal; Notable for the following:    Glucose-Capillary 318 (*)    All other components within normal limits  CBC  GC/CHLAMYDIA PROBE AMP ([place]) NOT AT [facility]    Imaging Review No results found.   EKG Interpretation None      MDM   Final diagnoses:  Vulvovaginal candidiasis  Type 2 diabetes mellitus without  complication  Dysuria   Waverly Ferrari results of history and physical consistent with vulvovaginal candidiasis. Patient is a non-insulin-dependent diabetic. Elevated blood sugar here to 318. Patient reports no food since noon today. We'll check basic labs to ensure no DKA and urinalysis to ensure no urinary tract infection.  11:25 PM Labs reassuring without evidence of DKA. Anion gap 10. No ketones in her urine. No evidence of urinary tract infection. Wet prep with moderate clue cells and wet prep with few white blood cells. Vaginal discharge not consistent with BV. We'll begin treatment for yeast and have her follow-up with her OB/GYN in several days for recheck. Discussed reasons to return to the emergency department including persistently high blood sugars, worsening rash or infection or worsening discharge. Patient is without abdominal pain, nausea, vomiting, diarrhea, fevers or chills.  BP 145/78 mmHg  Pulse 67  Temp(Src) 98.3 F (36.8 C) (Oral)  Resp 20  SpO2 100%  I personally performed the services described in this documentation, which was scribed in my presence. The recorded information has been reviewed and is accurate.   Jarrett Soho Yuliza Cara, PA-C 01/15/15 0131  Milton Ferguson, MD 01/15/15 [phone]

## 2015-01-14 NOTE — ED Notes (Signed)
C/o yeast infection x 2 weeks.

## 2015-01-15 LAB — CBC
HCT: 36.2 % (ref 36.0–46.0)
Hemoglobin: 12.5 g/dL (ref 12.0–15.0)
MCH: 27.4 pg (ref 26.0–34.0)
MCHC: 34.5 g/dL (ref 30.0–36.0)
MCV: 79.4 fL (ref 78.0–100.0)
PLATELETS: 328 10*3/uL (ref 150–400)
RBC: 4.56 MIL/uL (ref 3.87–5.11)
RDW: 13.4 % (ref 11.5–15.5)
WBC: 8.4 10*3/uL (ref 4.0–10.5)

## 2015-01-15 LAB — GC/CHLAMYDIA PROBE AMP (~~LOC~~) NOT AT ARMC
CHLAMYDIA, DNA PROBE: NEGATIVE
Neisseria Gonorrhea: NEGATIVE

## 2015-01-15 MED ORDER — NYSTATIN 100000 UNIT/GM EX CREA
TOPICAL_CREAM | CUTANEOUS | Status: DC
Start: 2015-01-15 — End: 2017-03-26

## 2015-01-15 MED ORDER — FLUCONAZOLE 150 MG PO TABS: 150.0000 mg | ORAL_TABLET | Freq: Once | ORAL | Status: DC

## 2015-01-15 NOTE — Discharge Instructions (Signed)
1. Medications: diflucan, nystatin cream, usual home medications 2. Treatment: rest, drink plenty of fluids,  3. Follow Up: Please followup with your OB/GYN in 3 days for discussion of your diagnoses and further evaluation after today's visit; if you do not have a primary care doctor use the resource guide provided to find one; Please return to the ER for worsening symptoms, fevers or other concerns    Candida Infection A Candida infection (also called yeast, fungus, and Monilia infection) is an overgrowth of yeast that can occur anywhere on the body. A yeast infection commonly occurs in warm, moist body areas. Usually, the infection remains localized but can spread to become a systemic infection. A yeast infection may be a sign of a more severe disease such as diabetes, leukemia, or AIDS. A yeast infection can occur in both men and women. In women, Candida vaginitis is a vaginal infection. It is one of the most common causes of vaginitis. Men usually do not have symptoms or know they have an infection until other problems develop. Men may find out they have a yeast infection because their sex partner has a yeast infection. Uncircumcised men are more likely to get a yeast infection than circumcised men. This is because the uncircumcised glans is not exposed to air and does not remain as dry as that of a circumcised glans. Older adults may develop yeast infections around dentures. CAUSES  Women  Antibiotics.  Steroid medication taken for a long time.  Being overweight (obese).  Diabetes.  Poor immune condition.  Certain serious medical conditions.  Immune suppressive medications for organ transplant patients.  Chemotherapy.  Pregnancy.  Menstruation.  Stress and fatigue.  Intravenous drug use.  Oral contraceptives.  Wearing tight-fitting clothes in the crotch area.  Catching it from a sex partner who has a yeast infection.  Spermicide.  Intravenous, urinary, or other  catheters. Men  Catching it from a sex partner who has a yeast infection.  Having oral or anal sex with a person who has the infection.  Spermicide.  Diabetes.  Antibiotics.  Poor immune system.  Medications that suppress the immune system.  Intravenous drug use.  Intravenous, urinary, or other catheters. SYMPTOMS  Women  Thick, white vaginal discharge.  Vaginal itching.  Redness and swelling in and around the vagina.  Irritation of the lips of the vagina and perineum.  Blisters on the vaginal lips and perineum.  Painful sexual intercourse.  Low blood sugar (hypoglycemia).  Painful urination.  Bladder infections.  Intestinal problems such as constipation, indigestion, bad breath, bloating, increase in gas, diarrhea, or loose stools. Men  Men may develop intestinal problems such as constipation, indigestion, bad breath, bloating, increase in gas, diarrhea, or loose stools.  Dry, cracked skin on the penis with itching or discomfort.  Jock itch.  Dry, flaky skin.  Athlete's foot.  Hypoglycemia. DIAGNOSIS  Women  A history and an exam are performed.  The discharge may be examined under a microscope.  A culture may be taken of the discharge. Men  A history and an exam are performed.  Any discharge from the penis or areas of cracked skin will be looked at under the microscope and cultured.  Stool samples may be cultured. TREATMENT  Women  Vaginal antifungal suppositories and creams.  Medicated creams to decrease irritation and itching on the outside of the vagina.  Warm compresses to the perineal area to decrease swelling and discomfort.  Oral antifungal medications.  Medicated vaginal suppositories or cream for repeated  or recurrent infections.  Wash and dry the irritation areas before applying the cream.  Eating yogurt with Lactobacillus may help with prevention and treatment.  Sometimes painting the vagina with gentian violet  solution may help if creams and suppositories do not work. Men  Antifungal creams and oral antifungal medications.  Sometimes treatment must continue for 30 days after the symptoms go away to prevent recurrence. HOME CARE INSTRUCTIONS  Women  Use cotton underwear and avoid tight-fitting clothing.  Avoid colored, scented toilet paper and deodorant tampons or pads.  Do not douche.  Keep your diabetes under control.  Finish all the prescribed medications.  Keep your skin clean and dry.  Consume milk or yogurt with Lactobacillus-active culture regularly. If you get frequent yeast infections and think that is what the infection is, there are over-the-counter medications that you can get. If the infection does not show healing in 3 days, talk to your caregiver.  Tell your sex partner you have a yeast infection. Your partner may need treatment also, especially if your infection does not clear up or recurs. Men  Keep your skin clean and dry.  Keep your diabetes under control.  Finish all prescribed medications.  Tell your sex partner that you have a yeast infection so he or she can be treated if necessary. SEEK MEDICAL CARE IF:   Your symptoms do not clear up or worsen in one week after treatment.  You have an oral temperature above 102 F (38.9 C).  You have trouble swallowing or eating for a prolonged time.  You develop blisters on and around your vagina.  You develop vaginal bleeding and it is not your menstrual period.  You develop abdominal pain.  You develop intestinal problems as mentioned above.  You get weak or light-headed.  You have painful or increased urination.  You have pain during sexual intercourse. MAKE SURE YOU:   Understand these instructions.  Will watch your condition.  Will get help right away if you are not doing well or get worse. Document Released: 07/28/2004 Document Revised: 11/04/2013 Document Reviewed: 11/09/2009 [facility] Patient  Information 2015 [place], [place]. This information is not intended to replace advice given to you by your health care provider. Make sure you discuss any questions you have with your health care provider.

## 2015-08-03 ENCOUNTER — Emergency Department (HOSPITAL_COMMUNITY)
Admission: EM | Admit: 2015-08-03 | Discharge: 2015-08-03 | Disposition: A | Discharge status: Home / Self Care | Payer: Commercial Managed Care - HMO | Attending: Emergency Medicine | Admitting: Emergency Medicine

## 2015-08-03 ENCOUNTER — Encounter (HOSPITAL_COMMUNITY): Payer: Self-pay | Admitting: *Deleted

## 2015-08-03 DIAGNOSIS — Z7982 Long term (current) use of aspirin: Secondary | ICD-10-CM | POA: Diagnosis not present

## 2015-08-03 DIAGNOSIS — E119 Type 2 diabetes mellitus without complications: Secondary | ICD-10-CM | POA: Insufficient documentation

## 2015-08-03 DIAGNOSIS — R42 Dizziness and giddiness: Secondary | ICD-10-CM

## 2015-08-03 DIAGNOSIS — R51 Headache: Secondary | ICD-10-CM | POA: Diagnosis not present

## 2015-08-03 DIAGNOSIS — Z7984 Long term (current) use of oral hypoglycemic drugs: Secondary | ICD-10-CM | POA: Diagnosis not present

## 2015-08-03 DIAGNOSIS — Z79899 Other long term (current) drug therapy: Secondary | ICD-10-CM | POA: Diagnosis not present

## 2015-08-03 DIAGNOSIS — M1990 Unspecified osteoarthritis, unspecified site: Secondary | ICD-10-CM | POA: Insufficient documentation

## 2015-08-03 DIAGNOSIS — I10 Essential (primary) hypertension: Secondary | ICD-10-CM | POA: Insufficient documentation

## 2015-08-03 LAB — URINE MICROSCOPIC-ADD ON: RBC / HPF: NONE SEEN RBC/hpf (ref 0–5)

## 2015-08-03 LAB — URINALYSIS, ROUTINE W REFLEX MICROSCOPIC
Bilirubin Urine: NEGATIVE
HGB URINE DIPSTICK: NEGATIVE
Ketones, ur: NEGATIVE mg/dL
LEUKOCYTES UA: NEGATIVE
Nitrite: NEGATIVE
PH: 5.5 (ref 5.0–8.0)
Protein, ur: NEGATIVE mg/dL
Specific Gravity, Urine: 1.012 (ref 1.005–1.030)

## 2015-08-03 LAB — CBC
HEMATOCRIT: 38.4 % (ref 36.0–46.0)
Hemoglobin: 13.1 g/dL (ref 12.0–15.0)
MCH: 27.5 pg (ref 26.0–34.0)
MCHC: 34.1 g/dL (ref 30.0–36.0)
MCV: 80.7 fL (ref 78.0–100.0)
Platelets: 409 10*3/uL — ABNORMAL HIGH (ref 150–400)
RBC: 4.76 MIL/uL (ref 3.87–5.11)
RDW: 13.6 % (ref 11.5–15.5)
WBC: 9.1 10*3/uL (ref 4.0–10.5)

## 2015-08-03 LAB — BASIC METABOLIC PANEL
Anion gap: 16 — ABNORMAL HIGH (ref 5–15)
BUN: 12 mg/dL (ref 6–20)
CHLORIDE: 98 mmol/L — AB (ref 101–111)
CO2: 23 mmol/L (ref 22–32)
Calcium: 10.2 mg/dL (ref 8.9–10.3)
Creatinine, Ser: 0.8 mg/dL (ref 0.44–1.00)
GFR calc non Af Amer: >60 mL/min (ref >60)
Glucose, Bld: 248 mg/dL — ABNORMAL HIGH (ref 65–99)
POTASSIUM: 3.7 mmol/L (ref 3.5–5.1)
SODIUM: 137 mmol/L (ref 135–145)

## 2015-08-03 LAB — CBG MONITORING, ED: Glucose-Capillary: 230 mg/dL — ABNORMAL HIGH (ref 65–99)

## 2015-08-03 NOTE — ED Provider Notes (Signed)
CSN: [id number]     Arrival date & time 08/03/15  1556 History   First MD Initiated Contact with Patient 08/03/15 1919     Chief Complaint  Patient presents with  . Dizziness    HPI   Sarah Webster is a 60 y.o. female with a PMH of DM, HTN, arthritis who presents to the ED with dizziness, which she states started this morning when she woke up and was constant until around 3 PM, at which time her symptoms resolved. She denies exacerbating or alleviating factors. She states she did not try anything for symptom relief. She denies feeling off balanced or like the room was spinning, she states she just felt dizzy and like she might throw up. She denies fever, chills, vision changes, chest pain, shortness of breath, abdominal pain, N/V/D/C, numbness, weakness, paresthesia. She reports mild headache, now resolved, and states she has a history of headaches and that her symptoms feel similar.   Past Medical History  Diagnosis Date  . Arthritis   . Diabetes mellitus   . Hypertension    Past Surgical History  Procedure Laterality Date  . Hand surgery    . Carpal tunnel release    . Abdominal hysterectomy     No family history on file. Social History  Substance Use Topics  . Smoking status: Never Smoker   . Smokeless tobacco: Current User    Types: Chew  . Alcohol Use: No   OB History    No data available      Review of Systems  Constitutional: Negative for fever and chills.  Eyes: Negative for visual disturbance.  Respiratory: Negative for shortness of breath.   Cardiovascular: Negative for chest pain.  Gastrointestinal: Negative for nausea, vomiting, abdominal pain, diarrhea and constipation.  Musculoskeletal: Negative for neck pain.  Neurological: Positive for dizziness and headaches. Negative for syncope, facial asymmetry, speech difficulty, weakness, light-headedness and numbness.  All other systems reviewed and are negative.     Allergies  Levaquin; Other; Sulfa  antibiotics; Yellow dyes (non-tartrazine); Ciprofloxacin; Contrast media; and Ponstel  Home Medications   Prior to Admission medications   Medication Sig Start Date End Date Taking? Authorizing Provider  ALPRAZolam (XANAX) 0.25 MG tablet Take 0.25 mg by mouth 2 (two) times daily as needed for anxiety.    Yes Historical Provider, MD  aspirin 81 MG tablet Take 81 mg by mouth daily.   Yes Historical Provider, MD  Dapagliflozin Propanediol (FARXIGA) 5 MG TABS Take 10 mg by mouth daily.    Yes Historical Provider, MD  ezetimibe-simvastatin (VYTORIN) 10-20 MG per tablet Take 1 tablet by mouth every morning.   Yes Historical Provider, MD  glimepiride (AMARYL) 2 MG tablet Take 2 mg by mouth daily with breakfast.   Yes Historical Provider, MD  HYDROcodone-acetaminophen (NORCO/VICODIN) 5-325 MG per tablet Take 1 tablet by mouth 2 (two) times daily as needed for moderate pain.  12/19/14  Yes Historical Provider, MD  ibuprofen (ADVIL,MOTRIN) 800 MG tablet Take 800 mg by mouth 2 (two) times daily as needed for mild pain.  12/26/14  Yes Historical Provider, MD  metFORMIN (GLUCOPHAGE) 1000 MG tablet Take 1,000 mg by mouth 2 (two) times daily with a meal.   Yes Historical Provider, MD  omeprazole (PRILOSEC) 20 MG capsule Take 20 mg by mouth 2 (two) times daily. 12/06/14  Yes Historical Provider, MD  TRESIBA FLEXTOUCH 200 UNIT/ML SOPN Inject 10 Units into the skin at bedtime. 06/24/15  Yes Historical Provider, MD  valsartan-hydrochlorothiazide (DIOVAN-HCT) 160-12.5 MG per tablet Take 1 tablet by mouth daily. 12/05/14  Yes Historical Provider, MD  Vitamin D, Ergocalciferol, (DRISDOL) 50000 units CAPS capsule Take 50,000 Units by mouth 2 (two) times a week. 06/24/15  Yes Historical Provider, MD  fluconazole (DIFLUCAN) 150 MG tablet Take 1 tablet (150 mg total) by mouth once. In 72 hours if symptoms persist, repeat 172 hours after that if symptoms persist 01/15/15   Jarrett Soho Muthersbaugh, PA-C  nystatin cream (MYCOSTATIN)  Apply to affected area 2 times daily 01/15/15   Jarrett Soho Muthersbaugh, PA-C    BP 129/69 mmHg  Pulse 63  Temp(Src) 98.6 F (37 C) (Oral)  Resp 18  Ht 5\' 1"  (1.549 m)  Wt 94.348 kg  BMI 39.32 kg/m2  SpO2 100% Physical Exam  Constitutional: She is oriented to person, place, and time. She appears well-developed and well-nourished. No distress.  HENT:  Head: Normocephalic and atraumatic.  Right Ear: External ear normal.  Left Ear: External ear normal.  Nose: Nose normal.  Mouth/Throat: Uvula is midline, oropharynx is clear and moist and mucous membranes are normal.  Eyes: Conjunctivae, EOM and lids are normal. Pupils are equal, round, and reactive to light. Right eye exhibits no discharge. Left eye exhibits no discharge. No scleral icterus.  Neck: Normal range of motion. Neck supple.  Cardiovascular: Normal rate, regular rhythm, normal heart sounds, intact distal pulses and normal pulses.   Pulmonary/Chest: Effort normal and breath sounds normal. No respiratory distress. She has no wheezes. She has no rales.  Abdominal: Soft. Normal appearance and bowel sounds are normal. She exhibits no distension and no mass. There is no tenderness. There is no rigidity, no rebound and no guarding.  Musculoskeletal: Normal range of motion. She exhibits no edema or tenderness.  Neurological: She is alert and oriented to person, place, and time. She has normal strength. No cranial nerve deficit or sensory deficit. She displays a negative Romberg sign. Coordination and gait normal.  Skin: Skin is warm, dry and intact. No rash noted. She is not diaphoretic. No erythema. No pallor.  Psychiatric: She has a normal mood and affect. Her speech is normal and behavior is normal.  Nursing note and vitals reviewed.   ED Course  Procedures (including critical care time)  Labs Review Labs Reviewed  BASIC METABOLIC PANEL - Abnormal; Notable for the following:    Chloride 98 (*)    Glucose, Bld 248 (*)    Anion gap  16 (*)    All other components within normal limits  CBC - Abnormal; Notable for the following:    Platelets 409 (*)    All other components within normal limits  URINALYSIS, ROUTINE W REFLEX MICROSCOPIC (NOT AT [facility]) - Abnormal; Notable for the following:    Glucose, UA >1000 (*)    All other components within normal limits  URINE MICROSCOPIC-ADD ON - Abnormal; Notable for the following:    Squamous Epithelial / LPF 0-5 (*)    Bacteria, UA RARE (*)    All other components within normal limits  CBG MONITORING, ED - Abnormal; Notable for the following:    Glucose-Capillary 230 (*)    All other components within normal limits    Imaging Review No results found.   I have personally reviewed and evaluated these lab results as part of my medical decision-making.   EKG Interpretation None      MDM   Final diagnoses:  Dizziness    60 year old presents with dizziness, now resolved.  Denies fever, chills, vision changes, chest pain, shortness of breath, abdominal pain, N/V/D/C, numbness, weakness, paresthesia. Patient is afebrile. Vital signs stable. Normal neuro exam with no focal deficit. Patient ambulates without difficulty. EKG sinus rhythm. HR 88. CBC negative for leukocytosis or anemia. BMP with elevated glucose. UA no evidence of infection. Patient is non-toxic and well-appearing. Do not feel further emergent evaluation is indicated at this time. Patient to follow-up with PCP. Return precautions discussed. Patient verbalizes her understanding and is in agreement with plan.  BP 129/69 mmHg  Pulse 63  Temp(Src) 98.6 F (37 C) (Oral)  Resp 18  Ht 5\' 1"  (1.549 m)  Wt 94.348 kg  BMI 39.32 kg/m2  SpO2 100%     Marella Chimes, PA-C 08/04/15 0125  Noemi Chapel, MD 08/05/15 [phone]

## 2015-08-03 NOTE — Discharge Instructions (Signed)
1. Medications: usual home medications 2. Treatment: rest, drink plenty of fluids 3. Follow Up: please followup with your primary doctor in 2-3 days for discussion of your diagnoses and further evaluation after today's visit; if you do not have a primary care doctor use the resource guide provided to find one; please return to the ER for severe headache, increased dizziness, chest pain, shortness of breath, new or worsening symptoms   Dizziness Dizziness is a common problem. It is a feeling of unsteadiness or light-headedness. You may feel like you are about to faint. Dizziness can lead to injury if you stumble or fall. Anyone can become dizzy, but dizziness is more common in older adults. This condition can be caused by a number of things, including medicines, dehydration, or illness. HOME CARE INSTRUCTIONS Taking these steps may help with your condition: Eating and Drinking  Drink enough fluid to keep your urine clear or pale yellow. This helps to keep you from becoming dehydrated. Try to drink more clear fluids, such as water.  Do not drink alcohol.  Limit your caffeine intake if directed by your health care provider.  Limit your salt intake if directed by your health care provider. Activity  Avoid making quick movements.  Rise slowly from chairs and steady yourself until you feel okay.  In the morning, first sit up on the side of the bed. When you feel okay, stand slowly while you hold onto something until you know that your balance is fine.  Move your legs often if you need to stand in one place for a long time. Tighten and relax your muscles in your legs while you are standing.  Do not drive or operate heavy machinery if you feel dizzy.  Avoid bending down if you feel dizzy. Place items in your home so that they are easy for you to reach without leaning over. Lifestyle  Do not use any tobacco products, including cigarettes, chewing tobacco, or electronic cigarettes. If you need  help quitting, ask your health care provider.  Try to reduce your stress level, such as with yoga or meditation. Talk with your health care provider if you need help. General Instructions  Watch your dizziness for any changes.  Take medicines only as directed by your health care provider. Talk with your health care provider if you think that your dizziness is caused by a medicine that you are taking.  Tell a friend or a family member that you are feeling dizzy. If he or she notices any changes in your behavior, have this person call your health care provider.  Keep all follow-up visits as directed by your health care provider. This is important. SEEK MEDICAL CARE IF:  Your dizziness does not go away.  Your dizziness or light-headedness gets worse.  You feel nauseous.  You have reduced hearing.  You have new symptoms.  You are unsteady on your feet or you feel like the room is spinning. SEEK IMMEDIATE MEDICAL CARE IF:  You vomit or have diarrhea and are unable to eat or drink anything.  You have problems talking, walking, swallowing, or using your arms, hands, or legs.  You feel generally weak.  You are not thinking clearly or you have trouble forming sentences. It may take a friend or family member to notice this.  You have chest pain, abdominal pain, shortness of breath, or sweating.  Your vision changes.  You notice any bleeding.  You have a headache.  You have neck pain or a stiff neck.  You have a fever.   This information is not intended to replace advice given to you by your health care provider. Make sure you discuss any questions you have with your health care provider.   Document Released: 12/14/2000 Document Revised: 11/04/2014 Document Reviewed: 06/16/2014 Elsevier Interactive Patient Education 2016 [employer].   Emergency Department Resource Guide 1) Find a Doctor and Pay Out of Pocket Although you won't have to find out who is covered by your  insurance plan, it is a good idea to ask around and get recommendations. You will then need to call the office and see if the doctor you have chosen will accept you as a new patient and what types of options they offer for patients who are self-pay. Some doctors offer discounts or will set up payment plans for their patients who do not have insurance, but you will need to ask so you aren't surprised when you get to your appointment.  2) Contact Your Local Health Department Not all health departments have doctors that can see patients for sick visits, but many do, so it is worth a call to see if yours does. If you don't know where your local health department is, you can check in your phone book. The CDC also has a tool to help you locate your state's health department, and many state websites also have listings of all of their local health departments.  3) Find a Success Clinic If your illness is not likely to be very severe or complicated, you may want to try a walk in clinic. These are popping up all over the country in pharmacies, drugstores, and shopping centers. They're usually staffed by nurse practitioners or physician assistants that have been trained to treat common illnesses and complaints. They're usually fairly quick and inexpensive. However, if you have serious medical issues or chronic medical problems, these are probably not your best option.  No Primary Care Doctor: - Call Health Connect at  [phone] - they can help you locate a primary care doctor that  accepts your insurance, provides certain services, etc. - Physician Referral Service- [phone]  Chronic Pain Problems: Organization         Address  Phone   Notes  [facility]  [phone] Patients need to be referred by their primary care doctor.   Medication Assistance: Organization         Address  Phone   Notes  [facility] Medication [facility] [address] [place], [place] [address] [phone] --Must be a resident of [facility] -- Must have NO insurance coverage whatsoever (no Medicaid/ Medicare, etc.) -- The pt. MUST have a primary care doctor that directs their care regularly and follows them in the community   [facility]ist  [phone]   [employer]  [phone]    Agencies that provide inexpensive medical care: Organization         Address  Phone   Notes  [place]  [phone]   [facility]    [phone]   [facility] [place], [place] [address] [phone]   [place] [address] [place] [phone]   Planned Parenthood    [phone]   [facility]    [phone]   [place] and [place] [employer], [place] Phone:  [phone], Fax:  [phone] Hours  of Operation:  9 am - 6 pm, M-F.  Also accepts Medicaid/Medicare and self-pay.  [facility] for [place], [address], [place] Phone: [phone], Fax: [phone]. Hours of Operation:  8:30 am - 5:30 pm, M-F.  Also accepts Medicaid and self-pay.  [facility] [place] [address], [place] Phone: [phone]   [place], [place], [place] [phone], Ext. [phone] Mondays & Thursdays: 7-9 AM.  First 15 patients are seen on a first come, first serve basis.    [place] Providers:  Organization         Address  Phone   Notes  [facility] [address], [place] [phone] Also accepts self-pay patients.  [facility] [id number] [place], [place]  [phone]   [place], [address], [place] [phone]   [facility] [address], [place] [phone]   Lucianne Lei [address], [place]   [phone] Only accepts [place] Access [place] patients after they have their name applied to their card.   Self-Pay (no insurance) in [facility]:  Organization         Address  Phone   Notes  Sickle Cell Patients, [facility] [place] [phone]   [facility] [place] [phone]   [facility] [place]  [place], [place], [place] [phone]   [facility]/Dr. Osei-Bonsu  [address], [place] or [address], [place] [phone] Phone number for both [place] and [place] locations is the same.  [facility] [address], [place] [phone]   [facility] [address] [place] or [address] [phone]   [facility] [address] [place] [phone], phone; [phone], fax Sees patients 1st and 3rd Saturday of every month.  Must not qualify for public or private insurance (i.e. Medicaid, Medicare, [place] Health Choice, Veterans' Benefits)  Household income should be no more than 200% of the poverty level The clinic cannot treat you if you are pregnant or think you are pregnant  Sexually transmitted diseases are not treated at the clinic.    Dental Care: Organization         Address  Phone  Notes  [facility] Department of [facility] [phone] Accepts children up to age 81 who are enrolled in [place] or [place]; pregnant women with a Medicaid card; and children who have applied for Medicaid or [place] Health Choice, but were declined, whose parents can pay a reduced fee at time of service.  [facility] Department of [facility]  [address], [place] [phone] Accepts children up to age 64 who are enrolled  in [place] or [place]; pregnant women with a Medicaid card; and children who have applied for Medicaid or  Health Choice, but were declined, whose parents can pay a reduced fee at time of service.  [place] Adult Dental Access PROGRAM  [place] [phone] Patients are seen by appointment only. Walk-ins are not accepted. [place] will see  patients 17 years of age and older. Monday - Tuesday (8am-5pm) Most Wednesdays (8:30-5pm) $30 per visit, cash only  [facility] Adult Dental Access PROGRAM  [address], [place] [phone] Patients are seen by appointment only. Walk-ins are not accepted. [place] will see patients 17 years of age and older. One Wednesday Evening (Monthly: Volunteer Based).  $30 per visit, cash only  [place]  [phone] for adults; Children under age 76, call Graduate Pediatric Dentistry at [phone]. Children aged 90-14, please call [phone] to request a pediatric application.  Dental services are provided in all areas of dental care including fillings, crowns and bridges, complete and partial dentures, implants, gum treatment, root canals, and extractions. Preventive care is also provided. Treatment is provided to both adults and children. Patients are selected via a lottery and there is often a waiting list.   [facility] [address], [place]  [phone] [email or web address]   [facility] [address] [place], [place] [phone], Ext. [phone] Second and Fourth Thursday of each month, opens at 6:30 AM; Clinic ends at 9 AM.  Patients are seen on a first-come first-served basis, and a limited number are seen during each clinic.   [facility]  [address] [place], [place] [phone]   Eligibility Requirements You must have lived in [place], [place], or [place] counties for at least the last three months.   You cannot be  eligible for state or federal sponsored [employer], including [employer], [place], or [employer].   You generally cannot be eligible for healthcare insurance through your employer.    How to apply: Eligibility screenings are held every Tuesday and Wednesday afternoon from 1:00 pm until 4:00 pm. You do not need an appointment for the interview!  [facility] [address] [place], [place]   [place]  [place] Department  [place]  [phone]    Behavioral Health Resources in the Community: Intensive Outpatient Programs Organization         Address  Phone  Notes  [place]. [address] [place], [place] [phone]   [facility] Outpatient [address], [place], [place]   ADS: Alcohol & Drug Svcs [address] [place], [place]   [place] [address]  [place], [place] or [phone]   Substance Abuse Resources Organization         Address  Phone  Notes  Alcohol and Drug Services  [phone]   [place]  [phone]   [facility] [place]   Chinita Pester  [phone]   Residential & Outpatient Substance Abuse Program  [phone]   Psychological Services Organization         Address  Phone  Notes  [facility] [place]  [place]  [phone]   [place] [address] [place] or [phone]    Mobile Crisis Teams Organization         Address  Phone  Notes  [facility], Mobile Crisis Care Unit  [phone]   Assertive Psychotherapeutic Services  [address] [place], [place]   [facility] [address] [place] [phone]    Self-Help/Support Groups Organization  Address  Phone             Notes  [place]. of [place] - variety of support groups  [place] Call for more information  Narcotics Anonymous (NA), [facility] [address], [employer]   2 meetings at this location   Special educational needs teacher         Address  Phone  Notes  ASAP Residential Treatment [place],    [place]  [phone]   [facility]  [address], [place] [id number], [place], [place]   [place], [place] [phone] Admissions: 8am-3pm M-F  Incentives Substance [place] [address]    [place], [place] [phone]   [facility] [address] [place], [place], [place]   [facility] [address],  [place], [place]   [facility] - Intensive Outpatient [address], [place], [place]   [facility] ([place].,  [place], [place] [phone] or [phone]   [facility] (RTS) [address], [place], [place] Accepts Medicaid  [facility] [place] [address],  [place] [phone] Substance Abuse/Addiction Treatment   [facility] Organization         Address  Phone  Notes  [facility]  [phone]   Domenic Schwab, PhD [address] [place]lis [place], [place]   [phone] or [phone]   [place], [place] [phone]   [facility] [address] [place], [place] [phone] Insurance/Medicaid/sponsorship through [facility] and Families [address], [address] [place]                                    [place], [place] [phone] [place] [address], [place] [phone]    Dr. Adele Schilder  [phone]   [facility] Dept. 1) [address] [place] 2) [place] 3)  [place] [address], [place] [phone]  [phone]   [place] [phone] or [phone] (After Hours)

## 2015-08-03 NOTE — ED Notes (Signed)
Pt verbalized understanding to follow up with pcp tomorrow. Pt stable, ambulatory and NAD with no dizziness upon d/c.

## 2015-08-03 NOTE — ED Notes (Signed)
Pt states dizziness when she woke up this am.  Neuro intact.  CBG 230 in triage.  Dizziness improved with laying down.

## 2015-11-16 ENCOUNTER — Other Ambulatory Visit: Payer: Self-pay

## 2015-11-16 DIAGNOSIS — Z1231 Encounter for screening mammogram for malignant neoplasm of breast: Secondary | ICD-10-CM

## 2015-12-23 ENCOUNTER — Ambulatory Visit
Admission: RE | Admit: 2015-12-23 | Discharge: 2015-12-23 | Disposition: A | Discharge status: Home / Self Care | Payer: Medicare HMO | Source: Ambulatory Visit

## 2015-12-23 ENCOUNTER — Other Ambulatory Visit: Payer: Self-pay | Admitting: Family

## 2015-12-23 DIAGNOSIS — Z1231 Encounter for screening mammogram for malignant neoplasm of breast: Secondary | ICD-10-CM

## 2016-03-06 ENCOUNTER — Emergency Department (HOSPITAL_COMMUNITY)
Admission: EM | Admit: 2016-03-06 | Discharge: 2016-03-06 | Disposition: A | Discharge status: Home / Self Care | Payer: Medicare HMO | Attending: Emergency Medicine | Admitting: Emergency Medicine

## 2016-03-06 ENCOUNTER — Encounter (HOSPITAL_COMMUNITY): Payer: Self-pay

## 2016-03-06 DIAGNOSIS — Z7984 Long term (current) use of oral hypoglycemic drugs: Secondary | ICD-10-CM | POA: Insufficient documentation

## 2016-03-06 DIAGNOSIS — F17220 Nicotine dependence, chewing tobacco, uncomplicated: Secondary | ICD-10-CM | POA: Insufficient documentation

## 2016-03-06 DIAGNOSIS — M79604 Pain in right leg: Secondary | ICD-10-CM | POA: Diagnosis present

## 2016-03-06 DIAGNOSIS — Z7982 Long term (current) use of aspirin: Secondary | ICD-10-CM | POA: Diagnosis not present

## 2016-03-06 DIAGNOSIS — E119 Type 2 diabetes mellitus without complications: Secondary | ICD-10-CM | POA: Diagnosis not present

## 2016-03-06 DIAGNOSIS — I10 Essential (primary) hypertension: Secondary | ICD-10-CM | POA: Diagnosis not present

## 2016-03-06 MED ORDER — PREDNISONE 10 MG (21) PO TBPK: 10.0000 mg | ORAL_TABLET | Freq: Every day | ORAL | 0 refills | Status: DC

## 2016-03-06 NOTE — ED Notes (Signed)
HX OF CHRONIC LBP. TODAY, RADIATING TO RIGHT LEG. AMBULATORY.

## 2016-03-06 NOTE — Discharge Instructions (Addendum)
You have been seen today for leg pain. It is likely that your pain is due to nerve pain. Take naproxen or ibuprofen to reduce pain and inflammation. Prednisone may make your blood sugar somewhat higher on the meter. Follow up with PCP as soon as possible for reevaluation and chronic management of this issue. Return to ED should symptoms worsen.

## 2016-03-06 NOTE — ED Triage Notes (Signed)
Patient here with lower back pain with radiation down right leg x 2 months, pain with ambulation. Alert and oriented

## 2016-03-06 NOTE — ED Provider Notes (Signed)
[facility] DEPT Provider Note   CSN: [id number] Arrival date & time: 03/06/16  1032  By signing my name below, I, Sarah Webster, attest that this documentation has been prepared under the direction and in the presence of Sarah joy, PA-C. Electronically Signed: Judithe Webster, ER Scribe. 02/13/2016. 11:41 AM.   History   Chief Complaint Chief Complaint  Patient presents with  . Leg Pain   The history is provided by the patient. No language interpreter was used.   HPI Comments: Sarah Webster is a 60 y.o. female with a hx of DM who presents to the Emergency Department complaining of a moderate burning pain radiating down her anterior right leg from her right hip for the last two months. This pain is chronic for the patient. She has seen Dr. Maryjean Webster for her sx, and has an another appointment next week. Has not tried anything for her symptoms. Denies neuro deficits, falls, changes in bowel or bladder function, or any other complaints.    Past Medical History:  Diagnosis Date  . Arthritis   . Diabetes mellitus   . Hypertension     There are no active problems to display for this patient.   Past Surgical History:  Procedure Laterality Date  . ABDOMINAL HYSTERECTOMY    . CARPAL TUNNEL RELEASE    . HAND SURGERY      OB History    No data available       Home Medications    Prior to Admission medications   Medication Sig Start Date End Date Taking? Authorizing Provider  ALPRAZolam (XANAX) 0.25 MG tablet Take 0.25 mg by mouth 2 (two) times daily as needed for anxiety.     Historical Provider, MD  aspirin 81 MG tablet Take 81 mg by mouth daily.    Historical Provider, MD  Dapagliflozin Propanediol (FARXIGA) 5 MG TABS Take 10 mg by mouth daily.     Historical Provider, MD  ezetimibe-simvastatin (VYTORIN) 10-20 MG per tablet Take 1 tablet by mouth every morning.    Historical Provider, MD  fluconazole (DIFLUCAN) 150 MG tablet Take 1 tablet (150 mg total) by mouth once.  In 72 hours if symptoms persist, repeat 172 hours after that if symptoms persist 01/15/15   Jarrett Soho Muthersbaugh, PA-C  glimepiride (AMARYL) 2 MG tablet Take 2 mg by mouth daily with breakfast.    Historical Provider, MD  HYDROcodone-acetaminophen (NORCO/VICODIN) 5-325 MG per tablet Take 1 tablet by mouth 2 (two) times daily as needed for moderate pain.  12/19/14   Historical Provider, MD  ibuprofen (ADVIL,MOTRIN) 800 MG tablet Take 800 mg by mouth 2 (two) times daily as needed for mild pain.  12/26/14   Historical Provider, MD  metFORMIN (GLUCOPHAGE) 1000 MG tablet Take 1,000 mg by mouth 2 (two) times daily with a meal.    Historical Provider, MD  nystatin cream (MYCOSTATIN) Apply to affected area 2 times daily 01/15/15   Jarrett Soho Muthersbaugh, PA-C  omeprazole (PRILOSEC) 20 MG capsule Take 20 mg by mouth 2 (two) times daily. 12/06/14   Historical Provider, MD  predniSONE (STERAPRED UNI-PAK 21 TAB) 10 MG (21) TBPK tablet Take 1 tablet (10 mg total) by mouth daily. Take 6 tabs day 1, 5 tabs day 2, 4 tabs day 3, 3 tabs day 4, 2 tabs day 5, and 1 tab on day 6. 03/06/16   Sarah C Joy, PA-C  TRESIBA FLEXTOUCH 200 UNIT/ML SOPN Inject 10 Units into the skin at bedtime. 06/24/15   Historical Provider,  MD  valsartan-hydrochlorothiazide (DIOVAN-HCT) 160-12.5 MG per tablet Take 1 tablet by mouth daily. 12/05/14   Historical Provider, MD  Vitamin D, Ergocalciferol, (DRISDOL) 50000 units CAPS capsule Take 50,000 Units by mouth 2 (two) times a week. 06/24/15   Historical Provider, MD    Family History No family history on file.  Social History Social History  Substance Use Topics  . Smoking status: Never Smoker  . Smokeless tobacco: Current User    Types: Chew  . Alcohol use No     Allergies   Levaquin [levofloxacin]; Other; Sulfa antibiotics; Yellow dyes (non-tartrazine); Ciprofloxacin; Contrast media [iodinated diagnostic agents]; and Ponstel [mefenamic acid]   Review of Systems Review of Systems    Constitutional: Negative for chills and fever.  Musculoskeletal: Positive for myalgias.  Skin: Negative for color change and wound.  Neurological: Negative for weakness and numbness.     Physical Exam Updated Vital Signs BP 122/68 (BP Location: Right Arm)   Pulse 68   Temp 98.1 F (36.7 C) (Oral)   Resp 20   SpO2 100%   Physical Exam  Constitutional: She appears well-developed and well-nourished. No distress.  HENT:  Head: Normocephalic and atraumatic.  Eyes: Conjunctivae are normal.  Neck: Neck supple.  Cardiovascular: Normal rate and regular rhythm.   Pulmonary/Chest: Effort normal.  Musculoskeletal:  TTP to the right lateral hip and the right anterior upper leg. Full range of motion in the bilateral lower extremities.  Neurological: She is alert. She has normal reflexes.  No sensory deficits to the lower extremities bilaterally. Strength 5 out of 5.  Skin: Skin is warm and dry. She is not diaphoretic.  Psychiatric: She has a normal mood and affect. Her behavior is normal.  Nursing note and vitals reviewed.    ED Treatments / Results  DIAGNOSTIC STUDIES: Oxygen Saturation is 100% on RA, normal by my interpretation.    COORDINATION OF CARE: 11:42 AM Discussed treatment plan with pt at bedside which includes antiinflammatories, prednisone and pt agreed to plan.  Labs (all labs ordered are listed, but only abnormal results are displayed) Labs Reviewed - No data to display  EKG  EKG Interpretation None       Radiology No results found.  Procedures Procedures (including critical care time)  Medications Ordered in ED Medications - No data to display   Initial Impression / Assessment and Plan / ED Course  I have reviewed the triage vital signs and the nursing notes.  Pertinent labs & imaging results that were available during my care of the patient were reviewed by me and considered in my medical decision making (see chart for details).  Clinical Course     Suspect chronic neuralgia pain. No acute injury. No indication for imaging. Return precautions discussed. Patient encouraged to keep her appointment with Dr. Maryjean Webster, as planned.    Final Clinical Impressions(s) / ED Diagnoses   Final diagnoses:  Right leg pain    New Prescriptions Discharge Medication List as of 03/06/2016 11:52 AM    START taking these medications   Details  predniSONE (STERAPRED UNI-PAK 21 TAB) 10 MG (21) TBPK tablet Take 1 tablet (10 mg total) by mouth daily. Take 6 tabs day 1, 5 tabs day 2, 4 tabs day 3, 3 tabs day 4, 2 tabs day 5, and 1 tab on day 6., Starting Sun 03/06/2016, Print         I personally performed the services described in this documentation, which was scribed in my presence. The recorded  information has been reviewed and is accurate.    Lorayne Bender, PA-C 03/08/16 1807    Orlie Dakin, MD 03/10/16 1122

## 2016-03-28 ENCOUNTER — Other Ambulatory Visit: Payer: Self-pay | Admitting: Pharmacist

## 2016-03-28 NOTE — Patient Outreach (Signed)
Outreach call to Sarah Webster regarding her request for follow up from the [facility] Medication Adherence Campaign. Called and spoke with patient. HIPAA identifiers verified and verbal consent received.  Patient reports that she has been taking her valsartan-hydrochlorothiazide once daily as directed. Denies any missed doses or any barriers to taking her medications such as cost or side effects. Reports that she is no longer a patient of Dr. Baird Cancer at [facility]. Reports that she has switched PCPs to [name] at [facility].   Patient does reports that she skips taking all of her medications on the mornings when she is going to have an appointment. Reports that she has not been instructed to do this. Reports that she has been told that she only needs to hold food for blood work, but reports that she also holds her medications. Counsel patient about the importance of continuing to take her medications on the days that she has appointments as well, unless instructed to hold them by her provider. Discuss how holding her blood pressure and other medications could cause her blood pressure and other values to be elevated, not giving her provider accurate readings. Patient verbalizes understanding.   Patient reports that she has no medication questions or concerns at this time.  Harlow Asa, PharmD Clinical Pharmacist [facility] [phone]

## 2016-04-16 ENCOUNTER — Emergency Department (HOSPITAL_COMMUNITY)
Admission: EM | Admit: 2016-04-16 | Discharge: 2016-04-16 | Disposition: A | Discharge status: Home / Self Care | Payer: Medicare HMO | Attending: Emergency Medicine | Admitting: Emergency Medicine

## 2016-04-16 ENCOUNTER — Encounter (HOSPITAL_COMMUNITY): Payer: Self-pay | Admitting: Emergency Medicine

## 2016-04-16 ENCOUNTER — Emergency Department (HOSPITAL_COMMUNITY): Payer: Medicare HMO

## 2016-04-16 DIAGNOSIS — E119 Type 2 diabetes mellitus without complications: Secondary | ICD-10-CM | POA: Diagnosis not present

## 2016-04-16 DIAGNOSIS — I10 Essential (primary) hypertension: Secondary | ICD-10-CM | POA: Diagnosis not present

## 2016-04-16 DIAGNOSIS — R1031 Right lower quadrant pain: Secondary | ICD-10-CM | POA: Insufficient documentation

## 2016-04-16 DIAGNOSIS — Z79899 Other long term (current) drug therapy: Secondary | ICD-10-CM | POA: Insufficient documentation

## 2016-04-16 DIAGNOSIS — R1900 Intra-abdominal and pelvic swelling, mass and lump, unspecified site: Secondary | ICD-10-CM | POA: Diagnosis not present

## 2016-04-16 DIAGNOSIS — Z7982 Long term (current) use of aspirin: Secondary | ICD-10-CM | POA: Insufficient documentation

## 2016-04-16 DIAGNOSIS — Z7984 Long term (current) use of oral hypoglycemic drugs: Secondary | ICD-10-CM | POA: Insufficient documentation

## 2016-04-16 DIAGNOSIS — R102 Pelvic and perineal pain: Secondary | ICD-10-CM

## 2016-04-16 DIAGNOSIS — R109 Unspecified abdominal pain: Secondary | ICD-10-CM

## 2016-04-16 DIAGNOSIS — M25551 Pain in right hip: Secondary | ICD-10-CM | POA: Diagnosis present

## 2016-04-16 LAB — CBC WITH DIFFERENTIAL/PLATELET
BASOS PCT: 0 %
Basophils Absolute: 0 10*3/uL (ref 0.0–0.1)
EOS ABS: 0.1 10*3/uL (ref 0.0–0.7)
EOS PCT: 1 %
HCT: 39 % (ref 36.0–46.0)
Hemoglobin: 13 g/dL (ref 12.0–15.0)
LYMPHS ABS: 2.3 10*3/uL (ref 0.7–4.0)
Lymphocytes Relative: 34 %
MCH: 27 pg (ref 26.0–34.0)
MCHC: 33.3 g/dL (ref 30.0–36.0)
MCV: 81.1 fL (ref 78.0–100.0)
MONOS PCT: 6 %
Monocytes Absolute: 0.4 10*3/uL (ref 0.1–1.0)
Neutro Abs: 4.1 10*3/uL (ref 1.7–7.7)
Neutrophils Relative %: 59 %
PLATELETS: 348 10*3/uL (ref 150–400)
RBC: 4.81 MIL/uL (ref 3.87–5.11)
RDW: 13.8 % (ref 11.5–15.5)
WBC: 6.9 10*3/uL (ref 4.0–10.5)

## 2016-04-16 LAB — COMPREHENSIVE METABOLIC PANEL
ALT: 18 U/L (ref 14–54)
ANION GAP: 11 (ref 5–15)
AST: 11 U/L — ABNORMAL LOW (ref 15–41)
Albumin: 3.7 g/dL (ref 3.5–5.0)
Alkaline Phosphatase: 69 U/L (ref 38–126)
BUN: 13 mg/dL (ref 6–20)
CHLORIDE: 102 mmol/L (ref 101–111)
CO2: 25 mmol/L (ref 22–32)
Calcium: 9.8 mg/dL (ref 8.9–10.3)
Creatinine, Ser: 0.72 mg/dL (ref 0.44–1.00)
GFR calc non Af Amer: >60 mL/min (ref >60)
Glucose, Bld: 209 mg/dL — ABNORMAL HIGH (ref 65–99)
POTASSIUM: 3.9 mmol/L (ref 3.5–5.1)
SODIUM: 138 mmol/L (ref 135–145)
Total Bilirubin: 0.3 mg/dL (ref 0.3–1.2)
Total Protein: 7.3 g/dL (ref 6.5–8.1)

## 2016-04-16 LAB — URINALYSIS, ROUTINE W REFLEX MICROSCOPIC
BILIRUBIN URINE: NEGATIVE
Glucose, UA: >1000 mg/dL — AB
HGB URINE DIPSTICK: NEGATIVE
KETONES UR: NEGATIVE mg/dL
Leukocytes, UA: NEGATIVE
Nitrite: NEGATIVE
Protein, ur: NEGATIVE mg/dL
SPECIFIC GRAVITY, URINE: 1.028 (ref 1.005–1.030)
pH: 6 (ref 5.0–8.0)

## 2016-04-16 LAB — URINE MICROSCOPIC-ADD ON
BACTERIA UA: NONE SEEN
RBC / HPF: NONE SEEN RBC/hpf (ref 0–5)
WBC UA: NONE SEEN WBC/hpf (ref 0–5)

## 2016-04-16 LAB — LIPASE, BLOOD: Lipase: 20 U/L (ref 11–51)

## 2016-04-16 MED ORDER — ONDANSETRON HCL 4 MG/2ML IJ SOLN
4.0000 mg | Freq: Once | INTRAMUSCULAR | Status: DC
  Filled 2016-04-16: qty 2

## 2016-04-16 MED ORDER — BARIUM SULFATE 2.1 % PO SUSP
ORAL | Status: AC
  Filled 2016-04-16: qty 2

## 2016-04-16 NOTE — ED Triage Notes (Signed)
Pt states she has had right hip pain for 2 months. Pt was started on  Gabapentin and does not feel it has helped. Pt states she has not had much of an appetite. Denies N/V. Pt also complains of right lower abd/flank pain for 1 week. Denies urinary symptoms.

## 2016-04-16 NOTE — Discharge Instructions (Signed)
Follow up with the gynecologist for further evaluation of your pelvic mass. You need to have an MRI. Return to the ED if you develop new or worsening symptoms.

## 2016-04-16 NOTE — ED Notes (Signed)
Pt ambulated to restroom. 

## 2016-04-16 NOTE — ED Provider Notes (Signed)
[facility] DEPT Provider Note   CSN: [id number] Arrival date & time: 04/16/16  0744     History   Chief Complaint Chief Complaint  Patient presents with  . Flank Pain  . Hip Pain    HPI Sarah Webster is a 60 y.o. female.  Patient with R sided abdominal pain since yesterday that is constant.  Pain worse with palpation.  No nausea, vomiting. She denies any dysuria or hematuria. Denies any vaginal bleeding or discharge. She still has a good appetite. Reports normal bowel movements. Also has had ongoing right hip pain for the past 2 months that was attributed to her diabetic neuropathy. She is taking gabapentin without relief. No focal weakness. There is some numbness and tingling in her feet. No bowel or bladder incontinence. No fever or vomiting. No previous back surgeries.   The history is provided by the patient.  Flank Pain  Pertinent negatives include no chest pain, no abdominal pain, no headaches and no shortness of breath.  Hip Pain  Pertinent negatives include no chest pain, no abdominal pain, no headaches and no shortness of breath.    Past Medical History:  Diagnosis Date  . Arthritis   . Diabetes mellitus   . Hypertension     There are no active problems to display for this patient.   Past Surgical History:  Procedure Laterality Date  . ABDOMINAL HYSTERECTOMY    . CARPAL TUNNEL RELEASE    . HAND SURGERY      OB History    No data available       Home Medications    Prior to Admission medications   Medication Sig Start Date End Date Taking? Authorizing Provider  ALPRAZolam (XANAX) 0.25 MG tablet Take 0.25 mg by mouth 2 (two) times daily as needed for anxiety.     Historical Provider, MD  aspirin 81 MG tablet Take 81 mg by mouth daily.    Historical Provider, MD  Dapagliflozin Propanediol (FARXIGA) 5 MG TABS Take 10 mg by mouth daily.     Historical Provider, MD  ezetimibe-simvastatin (VYTORIN) 10-20 MG per tablet Take 1 tablet by mouth every  morning.    Historical Provider, MD  fluconazole (DIFLUCAN) 150 MG tablet Take 1 tablet (150 mg total) by mouth once. In 72 hours if symptoms persist, repeat 172 hours after that if symptoms persist 01/15/15   Jarrett Soho Muthersbaugh, PA-C  glimepiride (AMARYL) 2 MG tablet Take 2 mg by mouth daily with breakfast.    Historical Provider, MD  HYDROcodone-acetaminophen (NORCO/VICODIN) 5-325 MG per tablet Take 1 tablet by mouth 2 (two) times daily as needed for moderate pain.  12/19/14   Historical Provider, MD  ibuprofen (ADVIL,MOTRIN) 800 MG tablet Take 800 mg by mouth 2 (two) times daily as needed for mild pain.  12/26/14   Historical Provider, MD  metFORMIN (GLUCOPHAGE) 1000 MG tablet Take 1,000 mg by mouth 2 (two) times daily with a meal.    Historical Provider, MD  nystatin cream (MYCOSTATIN) Apply to affected area 2 times daily 01/15/15   Jarrett Soho Muthersbaugh, PA-C  omeprazole (PRILOSEC) 20 MG capsule Take 20 mg by mouth 2 (two) times daily. 12/06/14   Historical Provider, MD  predniSONE (STERAPRED UNI-PAK 21 TAB) 10 MG (21) TBPK tablet Take 1 tablet (10 mg total) by mouth daily. Take 6 tabs day 1, 5 tabs day 2, 4 tabs day 3, 3 tabs day 4, 2 tabs day 5, and 1 tab on day 6. 03/06/16   Shawn C  Joy, PA-C  TRESIBA FLEXTOUCH 200 UNIT/ML SOPN Inject 10 Units into the skin at bedtime. 06/24/15   Historical Provider, MD  valsartan-hydrochlorothiazide (DIOVAN-HCT) 160-12.5 MG per tablet Take 1 tablet by mouth daily. 12/05/14   Historical Provider, MD  Vitamin D, Ergocalciferol, (DRISDOL) 50000 units CAPS capsule Take 50,000 Units by mouth 2 (two) times a week. 06/24/15   Historical Provider, MD    Family History History reviewed. No pertinent family history.  Social History Social History  Substance Use Topics  . Smoking status: Never Smoker  . Smokeless tobacco: Current User    Types: Chew  . Alcohol use No     Allergies   Levaquin [levofloxacin]; Other; Sulfa antibiotics; Yellow dyes (non-tartrazine);  Ciprofloxacin; Contrast media [iodinated diagnostic agents]; and Ponstel [mefenamic acid]   Review of Systems Review of Systems  Constitutional: Negative for activity change, appetite change and fever.  HENT: Negative for congestion and rhinorrhea.   Eyes: Negative for visual disturbance.  Respiratory: Negative for cough, chest tightness and shortness of breath.   Cardiovascular: Negative for chest pain.  Gastrointestinal: Negative for abdominal pain, nausea and vomiting.  Genitourinary: Positive for flank pain. Negative for vaginal bleeding and vaginal discharge.  Musculoskeletal: Negative for arthralgias and myalgias.  Neurological: Negative for dizziness, weakness, light-headedness and headaches.   A complete 10 system review of systems was obtained and all systems are negative except as noted in the HPI and PMH.    Physical Exam Updated Vital Signs BP 104/57 (BP Location: Left Arm)   Pulse 85   Temp 98.4 F (36.9 C) (Oral)   Resp 16   Ht 5\' 1"  (1.549 m)   Wt 192 lb 8 oz (87.3 kg)   SpO2 100%   BMI 36.37 kg/m   Physical Exam  Constitutional: She is oriented to person, place, and time. She appears well-developed and well-nourished. No distress.  HENT:  Head: Normocephalic and atraumatic.  Mouth/Throat: Oropharynx is clear and moist. No oropharyngeal exudate.  Eyes: Conjunctivae and EOM are normal. Pupils are equal, round, and reactive to light.  Neck: Normal range of motion. Neck supple.  No meningismus.  Cardiovascular: Normal rate, regular rhythm, normal heart sounds and intact distal pulses.   No murmur heard. Pulmonary/Chest: Effort normal and breath sounds normal. No respiratory distress.  Abdominal: Soft. There is tenderness. There is no rebound and no guarding.  TTP RLQ. Voluntary guarding. No rebound  Musculoskeletal: Normal range of motion. She exhibits no edema or tenderness.  No CVAT 5/5 strength in bilateral lower extremities. Ankle plantar and dorsiflexion  intact. Great toe extension intact bilaterally. +2 DP and PT pulses. +2 patellar reflexes bilaterally. Normal gait.   Neurological: She is alert and oriented to person, place, and time. No cranial nerve deficit. She exhibits normal muscle tone. Coordination normal.   5/5 strength throughout. CN 2-12 intact.Equal grip strength.   Skin: Skin is warm.  Psychiatric: She has a normal mood and affect. Her behavior is normal.  Nursing note and vitals reviewed.    ED Treatments / Results  Labs (all labs ordered are listed, but only abnormal results are displayed) Labs Reviewed  URINALYSIS, ROUTINE W REFLEX MICROSCOPIC (NOT AT [facility]) - Abnormal; Notable for the following:       Result Value   Glucose, UA >1000 (*)    All other components within normal limits  URINE MICROSCOPIC-ADD ON - Abnormal; Notable for the following:    Squamous Epithelial / LPF 6-30 (*)    All other components  within normal limits  CBC WITH DIFFERENTIAL/PLATELET  COMPREHENSIVE METABOLIC PANEL  LIPASE, BLOOD    EKG  EKG Interpretation None       Radiology Ct Abdomen Pelvis Wo Contrast  Result Date: 04/16/2016 CLINICAL DATA:  RLQ pain STARTED YESTERDAY, PATIENT DENIES ANY "N/V/D" PATIENT HAS A DYE ALLERGY. PREVIOUS SURGERY HISTORY IS HYSTERECTOMY EXAM: CT ABDOMEN AND PELVIS WITHOUT CONTRAST TECHNIQUE: Multidetector CT imaging of the abdomen and pelvis was performed following the standard protocol without IV contrast. COMPARISON:  None. FINDINGS: Lower chest: No acute abnormality. Hepatobiliary: No focal liver abnormality is seen. No gallstones, gallbladder wall thickening, or biliary dilatation. Pancreas: Unremarkable. No pancreatic ductal dilatation or surrounding inflammatory changes. Spleen: Normal in size without focal abnormality. Adrenals/Urinary Tract: Adrenal glands appear normal. Kidneys are unremarkable without mass, stone or hydronephrosis. No ureteral or bladder calculi identified. Bladder appears normal,  partially decompressed. Stomach/Bowel: Bowel is normal in caliber. No bowel wall thickening or evidence of bowel wall inflammation. Stomach appears normal. Appendix is normal. Vascular/Lymphatic: Scattered mild atherosclerotic changes noted along the walls of the normal caliber abdominal aorta. No enlarged lymph nodes appreciated in the abdomen or pelvis. Reproductive: Status post hysterectomy. Lobular mixed density mass is seen within the left lower pelvis, likely associated with the left ovary, measuring 5.4 x 4.1 cm. Other: No free fluid or abscess collection. No free intraperitoneal air. Musculoskeletal: Degenerative changes at the bilateral hips, mild to moderate in degree, right greater than left. Additional mild degenerative change throughout the thoracolumbar spine. No acute or suspicious osseous finding. Superficial soft tissues are unremarkable. IMPRESSION: 1. Lobular mixed density mass within the left lower pelvis, measuring 5.4 x 4.1 cm, most likely associated with the left ovary, likely partially calcified, possibly a dermoid although no fat density within the lesion to confirm benignity. Neoplastic mass cannot be excluded. Recommend pelvic ultrasound for further characterization. Pelvic MRI may be eventually needed. 2. No acute findings within the abdomen or pelvis. No bowel obstruction or evidence of bowel wall inflammation. No renal or ureteral calculi. Appendix is normal. Right adnexal region appears normal. 3. Degenerative changes within the spine and at the bilateral hips, most prominent at the right hip. No acute or suspicious osseous finding. Electronically Signed   By: Franki Cabot M.D.   On: 04/16/2016 11:47    Procedures Procedures (including critical care time)  Medications Ordered in ED Medications  ondansetron (ZOFRAN) injection 4 mg (not administered)     Initial Impression / Assessment and Plan / ED Course  I have reviewed the triage vital signs and the nursing  notes.  Pertinent labs & imaging results that were available during my care of the patient were reviewed by me and considered in my medical decision making (see chart for details).  Clinical Course  Right lower quadrant pain without associated symptoms since yesterday. Ongoing right hip pain for the past 2 months, no neurological deficits.  UA negative.  WBC normal.   CT scan shows normal appendix. 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 Incidental finding of left sided pelvic mass as above. An ultrasound will be performed.  Patient confirms pelvic mass as above possibly from left ovary. Discussed with Dr. Rip Harbour of gynecology. He will arrange for outpatient follow-up patient will need MRI but this is not emergent today.  Follow up with GYN. Return precautions discussed.  Final Clinical Impressions(s) / ED Diagnoses   Final diagnoses:  Pelvic pain  Abdominal pain, unspecified abdominal location  Pelvic mass    New  Prescriptions New Prescriptions   No medications on file     Ezequiel Essex, MD 04/16/16 1730

## 2016-05-02 ENCOUNTER — Ambulatory Visit (INDEPENDENT_AMBULATORY_CARE_PROVIDER_SITE_OTHER): Payer: Medicare HMO | Admitting: Family Medicine

## 2016-05-02 ENCOUNTER — Encounter: Payer: Self-pay | Admitting: Family Medicine

## 2016-05-02 VITALS — BP 135/57 | HR 93 | Ht 61.0 in | Wt 193.0 lb

## 2016-05-02 DIAGNOSIS — R1900 Intra-abdominal and pelvic swelling, mass and lump, unspecified site: Secondary | ICD-10-CM | POA: Insufficient documentation

## 2016-05-02 DIAGNOSIS — E119 Type 2 diabetes mellitus without complications: Secondary | ICD-10-CM

## 2016-05-02 DIAGNOSIS — E1140 Type 2 diabetes mellitus with diabetic neuropathy, unspecified: Secondary | ICD-10-CM | POA: Insufficient documentation

## 2016-05-02 DIAGNOSIS — E1142 Type 2 diabetes mellitus with diabetic polyneuropathy: Secondary | ICD-10-CM | POA: Diagnosis not present

## 2016-05-02 DIAGNOSIS — I10 Essential (primary) hypertension: Secondary | ICD-10-CM | POA: Diagnosis not present

## 2016-05-02 DIAGNOSIS — K219 Gastro-esophageal reflux disease without esophagitis: Secondary | ICD-10-CM

## 2016-05-02 DIAGNOSIS — F410 Panic disorder [episodic paroxysmal anxiety] without agoraphobia: Secondary | ICD-10-CM | POA: Insufficient documentation

## 2016-05-02 NOTE — Patient Instructions (Signed)
Pelvic Mass A pelvic mass is an abnormal growth in the pelvis. The pelvis is the area between your hip bones. It includes the bladder and the rectum in males and females, and also the uterus and ovaries in females. CAUSES Many things can cause a pelvic mass, including:  Cancer.  Fibroids of the uterus.  Ovarian cysts.  Infection.  Ectopic pregnancy. SIGNS AND SYMPTOMS Symptoms of a pelvic mass may include:  Cramping.  Nausea.  Diarrhea.  Fever.  Vomiting.  Weakness.  Pain in the pelvis, side, or back.  Weight loss.  Constipation.  Problems with vaginal bleeding, including:  Light or heavy bleeding with or without blood clots.  Irregular menstruation.  Pain with menstruation.  Problems with urination, including:  Frequent urination.  Inability to empty the bladder completely.  Urinating very small amounts.  Pain with urination.  Bloody urine. Some pelvic masses do not cause symptoms. DIAGNOSIS To make a diagnosis, your health care provider will need to learn more about the mass. You may have tests or procedures done, such as:  Blood tests.  X-rays.  Ultrasound.  CT scan.  MRI.  A surgery to look inside of your abdomen with cameras (laparoscopy).  A biopsy that is performed with a needle or during laparoscopy or surgery. In some cases, what seemed like a pelvic mass may actually be something else, such as a mass in one of the organs that are near the pelvis, an infection (abscess) or scar tissue (adhesions) that formed after a surgery. TREATMENT Treatment will depend on the cause of the mass. HOME CARE INSTRUCTIONS What you need to do at home will depend on the cause of the mass. Follow the instructions that your health care provider gives to you. In general:  Keep all follow-up visits as directed by your health care provider. This is important.  Take medicines only as directed by your health care provider.  Follow any restrictions that  are given to you by your health care provider. SEEK MEDICAL CARE IF:  You develop new symptoms. SEEK IMMEDIATE MEDICAL CARE IF:  You vomit bright red blood or vomit material that looks like coffee grounds.  You have blood in your stools, or the stools turn black and tarry.  You have an abnormal or increased amount of vaginal bleeding.  You have a fever.  You develop easy bruising or bleeding.  You develop sudden or worsening pain that is not controlled by your medicine.  You feel worsening weakness, or you have a fainting episode.  You feel that the mass has suddenly gotten larger.  You develop severe bloating in your abdomen or your pelvis.  You cannot pass any urine.  You are unable to have a bowel movement.   This information is not intended to replace advice given to you by your health care provider. Make sure you discuss any questions you have with your health care provider.   Document Released: 09/27/2006 Document Revised: 07/11/2014 Document Reviewed: 02/03/2014 Elsevier Interactive Patient Education [employer].

## 2016-05-02 NOTE — Assessment & Plan Note (Signed)
Possible dermoid vs. Other neoplasm.Marland KitchenMarland KitchenWill get CA125 and MRI to further characterize. Discussed possible surgical removal. She wants her pain gone. I am not sure surgery on the opposite side will fix her pain. She cannot tell me her last A1C and says her eyes and kidneys are healthy. She does have diabetic neuropathy. Has been diabetic for 20 years. May not be a great surgical candidate but if benign, and given size could remove laparoscopically if necessary.

## 2016-05-02 NOTE — Progress Notes (Signed)
   Subjective:    Patient ID: Sarah Webster is a 60 y.o. female presenting with No chief complaint on file.  on 05/02/2016  HPI: Notes that she was found to have a pelvic mass when she went to the ED for right sided pain. CT scan revealed calcified mass of 3 cm on left. U/s is reviewed and notes the same findings. She has a h/o diabetes and hypertension. She has a h/o uterus removal (likely TVH) due to bleeding at age 56. SVD x 4 previously.  Review of Systems  Constitutional: Negative for chills and fever.  Respiratory: Negative for shortness of breath.   Cardiovascular: Negative for chest pain.  Gastrointestinal: Negative for abdominal pain, nausea and vomiting.  Genitourinary: Positive for pelvic pain (right sided for several days). Negative for dysuria, vaginal bleeding and vaginal discharge.  Skin: Negative for rash.      Objective:    BP (!) 135/57 (BP Location: Left Arm, Patient Position: Sitting)   Pulse 93   Ht 5\' 1"  (1.549 m)   Wt 193 lb (87.5 kg)   BMI 36.47 kg/m  Physical Exam  Constitutional: She is oriented to person, place, and time. She appears well-developed and well-nourished. No distress.  HENT:  Head: Normocephalic and atraumatic.  Eyes: No scleral icterus.  Neck: Neck supple.  Cardiovascular: Normal rate.   Pulmonary/Chest: Effort normal.  Abdominal: Soft.  Genitourinary: Vagina normal.  Genitourinary Comments: Adnexal exam is limited by body habitus, no appreciable mass noted and no tenderness  Neurological: She is alert and oriented to person, place, and time.  Skin: Skin is warm and dry.  Psychiatric: She has a normal mood and affect.   Pelvic sono 1. Shadowing mass within the LEFT adnexa appears to be associated with the LEFT ovary, measuring 3.4 x 2.6 cm, corresponding to the partially calcified mass seen on CT performed earlier today. This may represent an ovarian dermoid, however, there is no fat seen within the mass on CT to confirm benignity.  Neoplastic mass cannot be excluded, such as mucinous or serous ovarian tumors which can calcify. Recommend MRI for further characterization. Consider GYN consult for further workup considerations, including possible laparoscopic evaluation     Assessment & Plan:   Problem List Items Addressed This Visit      Unprioritized   Diabetes mellitus ([place])   Hypertension   Diabetic neuropathy ([place])   Anxiety attack   GERD (gastroesophageal reflux disease)   Pelvic mass in female - Primary    Possible dermoid vs. Other neoplasm.Marland KitchenMarland KitchenWill get CA125 and MRI to further characterize. Discussed possible surgical removal. She wants her pain gone. I am not sure surgery on the opposite side will fix her pain. She cannot tell me her last A1C and says her eyes and kidneys are healthy. She does have diabetic neuropathy. Has been diabetic for 20 years. May not be a great surgical candidate but if benign, and given size could remove laparoscopically if necessary.      Relevant Orders   CA 125   MR PELVIS W WO CONTRAST    Other Visit Diagnoses   None.     Total face-to-face time with patient: 30 minutes. Over 50% of encounter was spent on counseling and coordination of care. Return in about 4 weeks (around 05/30/2016).  Donnamae Jude 05/02/2016 11:06 AM

## 2016-05-03 LAB — CA 125: CA 125: 4 U/mL (ref <35)

## 2016-05-07 ENCOUNTER — Ambulatory Visit (HOSPITAL_BASED_OUTPATIENT_CLINIC_OR_DEPARTMENT_OTHER)
Admission: RE | Admit: 2016-05-07 | Discharge: 2016-05-07 | Disposition: A | Discharge status: Home / Self Care | Payer: Medicare HMO | Source: Ambulatory Visit | Attending: Family Medicine | Admitting: Family Medicine

## 2016-05-07 DIAGNOSIS — R1900 Intra-abdominal and pelvic swelling, mass and lump, unspecified site: Secondary | ICD-10-CM

## 2016-05-07 DIAGNOSIS — Z90710 Acquired absence of both cervix and uterus: Secondary | ICD-10-CM | POA: Diagnosis not present

## 2016-05-07 MED ORDER — GADOBENATE DIMEGLUMINE 529 MG/ML IV SOLN: 17.0000 mL | Freq: Once | INTRAVENOUS | Status: DC | PRN

## 2016-05-25 ENCOUNTER — Encounter: Payer: Medicare HMO | Admitting: Obstetrics and Gynecology

## 2016-05-30 ENCOUNTER — Ambulatory Visit (INDEPENDENT_AMBULATORY_CARE_PROVIDER_SITE_OTHER): Payer: Medicare HMO | Admitting: Family Medicine

## 2016-05-30 ENCOUNTER — Encounter: Payer: Self-pay | Admitting: Family Medicine

## 2016-05-30 VITALS — BP 93/44 | HR 70 | Resp 16 | Ht 61.0 in | Wt 193.0 lb

## 2016-05-30 DIAGNOSIS — R1900 Intra-abdominal and pelvic swelling, mass and lump, unspecified site: Secondary | ICD-10-CM | POA: Diagnosis not present

## 2016-05-30 NOTE — Patient Instructions (Addendum)
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 This is a benign mass of the ovary, that can be hormonally producing and should be removed. You need surgical clearance from your primary doctor.  Diagnostic Laparoscopy A diagnostic laparoscopy is a procedure to diagnose diseases in the abdomen. During the procedure, a thin, lighted, pencil-sized instrument called a laparoscope is inserted into the abdomen through an incision. The laparoscope allows your health care provider to look at the organs inside your body. Tell a health care provider about:  Any allergies you have.  All medicines you are taking, including vitamins, herbs, eye drops, creams, and over-the-counter medicines.  Any problems you or family members have had with anesthetic medicines.  Any blood disorders you have.  Any surgeries you have had.  Any medical conditions you have. What are the risks? Generally, this is a safe procedure. However, problems can occur, which may include:  Infection.  Bleeding.  Damage to other organs.  Allergic reaction to the anesthetics used during the procedure. What happens before the procedure?  Do not eat or drink anything after midnight on the night before the procedure or as directed by your health care provider.  Ask your health care provider about:  Changing or stopping your regular medicines.  Taking medicines such as aspirin and ibuprofen. These medicines can thin your blood. Do not take these medicines before your procedure if your health care provider instructs you not to.  Plan to have someone take you home after the procedure. What happens during the procedure?  You may be given a medicine to help you relax (sedative).  You will be given a medicine to make you sleep (general anesthetic).  Your abdomen will be inflated with a gas. This will make your organs easier to see.  Small incisions will be made in your abdomen.  A laparoscope and other small  instruments will be inserted into the abdomen through the incisions.  A tissue sample may be removed from an organ in the abdomen for examination.  The instruments will be removed from the abdomen.  The gas will be released.  The incisions will be closed with stitches (sutures). What happens after the procedure? Your blood pressure, heart rate, breathing rate, and blood oxygen level will be monitored often until the medicines you were given have worn off. This information is not intended to replace advice given to you by your health care provider. Make sure you discuss any questions you have with your health care provider. Document Released: 09/26/2000 Document Revised: 10/29/2015 Document Reviewed: 01/31/2014 Elsevier Interactive Patient Education  2017 [employer].

## 2016-05-30 NOTE — Progress Notes (Signed)
   Subjective:    Patient ID: Sarah Webster is a 60 y.o. female presenting with Follow-up (test results)  on 05/30/2016  HPI: Here to f/u MRI for left pelvic mass. 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 Has some sciatica and right sided pain which prompted initial u/s and f/u MRI. CA 125 is normal.  Review of Systems  Constitutional: Negative for chills and fever.  Respiratory: Negative for shortness of breath.   Cardiovascular: Negative for chest pain.  Gastrointestinal: Negative for abdominal pain, nausea and vomiting.  Genitourinary: Positive for pelvic pain. Negative for dysuria.  Skin: Negative for rash.      Objective:    BP (!) 93/44   Pulse 70   Resp 16   Ht 5\' 1"  (1.549 m)   Wt 193 lb (87.5 kg)   BMI 36.47 kg/m  Physical Exam  Constitutional: She is oriented to person, place, and time. She appears well-developed and well-nourished. No distress.  HENT:  Head: Normocephalic and atraumatic.  Eyes: No scleral icterus.  Neck: Neck supple.  Cardiovascular: Normal rate.   Pulmonary/Chest: Effort normal.  Abdominal: Soft.  Neurological: She is alert and oriented to person, place, and time.  Skin: Skin is warm and dry.  Psychiatric: She has a normal mood and affect.        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 Discussed pre-op clearance--needs. Risks include but are not limited to bleeding, infection, injury to surrounding structures, including bowel, bladder and ureters, blood clots, and death.  Likelihood of success is high.          Total face-to-face time with patient: 25 minutes. Over 50% of encounter was spent on counseling and coordination of care. Return in about 3 months (around 08/30/2016) for postop check.  Donnamae Jude 05/30/2016 8:35 AM

## 2016-05-30 NOTE — Assessment & Plan Note (Signed)
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 Discussed pre-op clearance--needs. Risks include but are not limited to bleeding, infection, injury to surrounding structures, including bowel, bladder and ureters, blood clots, and death.  Likelihood of success is high.

## 2016-06-03 ENCOUNTER — Encounter (HOSPITAL_COMMUNITY): Payer: Self-pay | Admitting: *Deleted

## 2016-07-25 ENCOUNTER — Encounter: Payer: Self-pay | Admitting: *Deleted

## 2016-07-28 ENCOUNTER — Emergency Department (HOSPITAL_COMMUNITY)
Admission: EM | Admit: 2016-07-28 | Discharge: 2016-07-28 | Disposition: A | Discharge status: Home / Self Care | Payer: Medicare HMO | Attending: Emergency Medicine | Admitting: Emergency Medicine

## 2016-07-28 ENCOUNTER — Encounter (HOSPITAL_COMMUNITY): Payer: Self-pay | Admitting: Emergency Medicine

## 2016-07-28 DIAGNOSIS — Z7982 Long term (current) use of aspirin: Secondary | ICD-10-CM | POA: Insufficient documentation

## 2016-07-28 DIAGNOSIS — Z7984 Long term (current) use of oral hypoglycemic drugs: Secondary | ICD-10-CM | POA: Insufficient documentation

## 2016-07-28 DIAGNOSIS — E1140 Type 2 diabetes mellitus with diabetic neuropathy, unspecified: Secondary | ICD-10-CM | POA: Diagnosis not present

## 2016-07-28 DIAGNOSIS — R1030 Lower abdominal pain, unspecified: Secondary | ICD-10-CM | POA: Diagnosis present

## 2016-07-28 DIAGNOSIS — I10 Essential (primary) hypertension: Secondary | ICD-10-CM | POA: Diagnosis not present

## 2016-07-28 DIAGNOSIS — R109 Unspecified abdominal pain: Secondary | ICD-10-CM

## 2016-07-28 LAB — COMPREHENSIVE METABOLIC PANEL
ALK PHOS: 73 U/L (ref 38–126)
ALT: 17 U/L (ref 14–54)
ANION GAP: 13 (ref 5–15)
AST: 14 U/L — ABNORMAL LOW (ref 15–41)
Albumin: 3.8 g/dL (ref 3.5–5.0)
BILIRUBIN TOTAL: 0.3 mg/dL (ref 0.3–1.2)
BUN: 13 mg/dL (ref 6–20)
CALCIUM: 10 mg/dL (ref 8.9–10.3)
CO2: 22 mmol/L (ref 22–32)
Chloride: 97 mmol/L — ABNORMAL LOW (ref 101–111)
Creatinine, Ser: 0.74 mg/dL (ref 0.44–1.00)
GFR calc Af Amer: >60 mL/min (ref >60)
GFR calc non Af Amer: >60 mL/min (ref >60)
Glucose, Bld: 290 mg/dL — ABNORMAL HIGH (ref 65–99)
POTASSIUM: 3.8 mmol/L (ref 3.5–5.1)
SODIUM: 132 mmol/L — AB (ref 135–145)
Total Protein: 7.7 g/dL (ref 6.5–8.1)

## 2016-07-28 LAB — URINALYSIS, ROUTINE W REFLEX MICROSCOPIC
BILIRUBIN URINE: NEGATIVE
Bacteria, UA: NONE SEEN
Glucose, UA: >=500 mg/dL — AB
HGB URINE DIPSTICK: NEGATIVE
KETONES UR: NEGATIVE mg/dL
Leukocytes, UA: NEGATIVE
NITRITE: NEGATIVE
PROTEIN: NEGATIVE mg/dL
Specific Gravity, Urine: 1.006 (ref 1.005–1.030)
pH: 6 (ref 5.0–8.0)

## 2016-07-28 LAB — CBC
HCT: 38.8 % (ref 36.0–46.0)
HEMOGLOBIN: 13.1 g/dL (ref 12.0–15.0)
MCH: 26.9 pg (ref 26.0–34.0)
MCHC: 33.8 g/dL (ref 30.0–36.0)
MCV: 79.7 fL (ref 78.0–100.0)
Platelets: 355 10*3/uL (ref 150–400)
RBC: 4.87 MIL/uL (ref 3.87–5.11)
RDW: 13.5 % (ref 11.5–15.5)
WBC: 8 10*3/uL (ref 4.0–10.5)

## 2016-07-28 MED ORDER — IBUPROFEN 800 MG PO TABS
800.0000 mg | ORAL_TABLET | Freq: Once | ORAL | Status: AC
  Administered 2016-07-28: 800 mg via ORAL
  Filled 2016-07-28: qty 1

## 2016-07-28 MED ORDER — ONDANSETRON HCL 4 MG/2ML IJ SOLN
4.0000 mg | Freq: Once | INTRAMUSCULAR | Status: DC
  Filled 2016-07-28: qty 2

## 2016-07-28 MED ORDER — HYDROMORPHONE HCL 2 MG/ML IJ SOLN
1.0000 mg | Freq: Once | INTRAMUSCULAR | Status: DC
  Filled 2016-07-28: qty 1

## 2016-07-28 MED ORDER — SODIUM CHLORIDE 0.9 % IV BOLUS (SEPSIS)
1000.0000 mL | Freq: Once | INTRAVENOUS | Status: AC
  Administered 2016-07-28: 1000 mL via INTRAVENOUS

## 2016-07-28 NOTE — ED Provider Notes (Signed)
[facility] DEPT Provider Note   CSN: [id number] Arrival date & time: 07/28/16  1536     History   Chief Complaint Chief Complaint  Patient presents with  . Abdominal Pain    HPI Sarah Webster is a 61 y.o. female.  HPI Pt has been having lower abdominal pain for months.  She was in the ED in October and had an abnormal CT scan.  She has been seen by Ob GYN and is scheduled for surgery in feb to have it removed.  Today the pain was more severe so she came to the emergency room to be evaluated. Pain is in her lower abdomen. Maybe more on the right side today. No vomiting.  No diarrhea.  No dysuria.  Some nausea.  She has been taking her pain medications but it did not help today.  Past Medical History:  Diagnosis Date  . Arthritis   . Diabetes mellitus   . Hypertension     Patient Active Problem List   Diagnosis Date Noted  . Diabetes mellitus ([place]) 05/02/2016  . Hypertension 05/02/2016  . Diabetic neuropathy ([place]) 05/02/2016  . Anxiety attack 05/02/2016  . GERD (gastroesophageal reflux disease) 05/02/2016  . Pelvic mass in female 05/02/2016    Past Surgical History:  Procedure Laterality Date  . ABDOMINAL HYSTERECTOMY    . CARPAL TUNNEL RELEASE    . HAND SURGERY      OB History    Gravida Para Term Preterm AB Living   4 4       3    SAB TAB Ectopic Multiple Live Births           4       Home Medications    Prior to Admission medications   Medication Sig Start Date End Date Taking? Authorizing Provider  ALPRAZolam (XANAX) 0.25 MG tablet Take 0.25 mg by mouth 2 (two) times daily as needed for anxiety.    Yes Historical Provider, MD  aspirin 81 MG tablet Take 81 mg by mouth daily.   Yes Historical Provider, MD  Cholecalciferol (VITAMIN D3) 5000 units CAPS Take 1 capsule by mouth daily.   Yes Historical Provider, MD  Cyanocobalamin (VITAMIN B-12) 3000 MCG SUBL Place 1 tablet under the tongue daily.   Yes Historical Provider, MD  Dapagliflozin Propanediol  (FARXIGA) 5 MG TABS Take 10 mg by mouth daily.    Yes Historical Provider, MD  ezetimibe-simvastatin (VYTORIN) 10-20 MG per tablet Take 1 tablet by mouth every morning.   Yes Historical Provider, MD  gabapentin (NEURONTIN) 300 MG capsule Take 300-600 mg by mouth 2 (two) times daily.   Yes Historical Provider, MD  HYDROcodone-acetaminophen (NORCO/VICODIN) 5-325 MG per tablet Take 1 tablet by mouth 2 (two) times daily as needed for moderate pain.  12/19/14  Yes Historical Provider, MD  metFORMIN (GLUCOPHAGE) 1000 MG tablet Take 1,000 mg by mouth 2 (two) times daily with a meal.   Yes Historical Provider, MD  nystatin cream (MYCOSTATIN) Apply to affected area 2 times daily Patient taking differently: Apply 1 application topically daily as needed for dry skin.  01/15/15  Yes Hannah Muthersbaugh, PA-C  omeprazole (PRILOSEC) 20 MG capsule Take 20 mg by mouth 2 (two) times daily. 12/06/14  Yes Historical Provider, MD  valsartan-hydrochlorothiazide (DIOVAN-HCT) 160-12.5 MG per tablet Take 1 tablet by mouth daily. 12/05/14  Yes Historical Provider, MD    Family History Family History  Problem Relation Age of Onset  . Cancer Sister     lung  .  Diabetes Sister   . Hypertension Sister     Social History Social History  Substance Use Topics  . Smoking status: Never Smoker  . Smokeless tobacco: Current User    Types: Chew  . Alcohol use No     Allergies   Contrast media [iodinated diagnostic agents]; Levaquin [levofloxacin]; Other; Ponstel [mefenamic acid]; Sulfa antibiotics; Yellow dyes (non-tartrazine); and Ciprofloxacin   Review of Systems Review of Systems  All other systems reviewed and are negative.    Physical Exam Updated Vital Signs BP 137/66   Pulse 72   Temp 98.6 F (37 C) (Oral)   Resp 20   Ht 5\' 1"  (1.549 m)   Wt 89.9 kg   SpO2 100%   BMI 37.46 kg/m   Physical Exam  Constitutional: No distress.  HENT:  Head: Normocephalic and atraumatic.  Right Ear: External ear  normal.  Left Ear: External ear normal.  Eyes: Conjunctivae are normal. Right eye exhibits no discharge. Left eye exhibits no discharge. No scleral icterus.  Neck: Neck supple. No tracheal deviation present.  Cardiovascular: Normal rate, regular rhythm and intact distal pulses.   Pulmonary/Chest: Effort normal and breath sounds normal. No stridor. No respiratory distress. She has no wheezes. She has no rales.  Abdominal: Soft. Bowel sounds are normal. She exhibits no distension. There is no tenderness. There is no rebound and no guarding.  Musculoskeletal: She exhibits no edema or tenderness.  Neurological: She is alert. She has normal strength. No cranial nerve deficit (no facial droop, extraocular movements intact, no slurred speech) or sensory deficit. She exhibits normal muscle tone. She displays no seizure activity. Coordination normal.  Skin: Skin is warm and dry. No rash noted.  Psychiatric: She has a normal mood and affect.  Nursing note and vitals reviewed.    ED Treatments / Results  Labs (all labs ordered are listed, but only abnormal results are displayed) Labs Reviewed  COMPREHENSIVE METABOLIC PANEL - Abnormal; Notable for the following:       Result Value   Sodium 132 (*)    Chloride 97 (*)    Glucose, Bld 290 (*)    AST 14 (*)    All other components within normal limits  URINALYSIS, ROUTINE W REFLEX MICROSCOPIC - Abnormal; Notable for the following:    Color, Urine STRAW (*)    Glucose, UA >=500 (*)    Squamous Epithelial / LPF 0-5 (*)    All other components within normal limits  CBC    Procedures Procedures (including critical care time)  Medications Ordered in ED Medications  HYDROmorphone (DILAUDID) injection 1 mg (1 mg Intravenous Refused 07/28/16 2031)  ondansetron (ZOFRAN) injection 4 mg (0 mg Intravenous Hold 07/28/16 2036)  sodium chloride 0.9 % bolus 1,000 mL (1,000 mLs Intravenous New Bag/Given 07/28/16 1942)  ibuprofen (ADVIL,MOTRIN) tablet 800 mg  (800 mg Oral Given 07/28/16 2035)     Initial Impression / Assessment and Plan / ED Course  I have reviewed the triage vital signs and the nursing notes.  Pertinent labs & imaging results that were available during my care of the patient were reviewed by me and considered in my medical decision making (see chart for details).   patient presented to the emergency room with complaints of chronic abdominal pain. After my evaluation I offered her pain medications. Patient Michela Pitcher that her pain was not that severe and she only wanted ibuprofen. Laboratory tests are reassuring. I doubt acute infection, obstruction, or other emergency medical condition. She can  continue her current home medications. Follow-up with her primary doctor or OB/GYN doctor. Monitor for fever or worsening symptoms.  Final Clinical Impressions(s) / ED Diagnoses   Final diagnoses:  Abdominal pain, unspecified abdominal location    New Prescriptions New Prescriptions   No medications on file     Dorie Rank, MD 07/28/16 2132

## 2016-07-28 NOTE — ED Triage Notes (Signed)
Pt reports RLQ pain x4 days, reports hx of ovarian cyst that is supposed to be removed on 2/6. Pt denies pain with urination, fever or chill but does report nausea.

## 2016-07-28 NOTE — Discharge Instructions (Signed)
Continue your medications, return to the emergency room for fever or vomiting, worsening symptoms

## 2016-08-02 NOTE — Patient Instructions (Signed)
Your procedure is scheduled on:  Tuesday, Aug 09, 2016  Enter through the [employer] [facility] at:  11:30 AM  Pick up the phone at the desk and dial [phone].  Call this number if you have problems the morning of surgery: [phone].  Remember: Do NOT eat food:  After Midnight Monday  Do NOT drink clear liquids after:  9:00 AM day of surgery  Take these medicines the morning of surgery with a SIP OF WATER:  Vytorin, Gabapentin, Omeprazole, Valsartan, Xanax if needed  Do NOT take Metformin the evening before your surgery.  Stop ALL herbal medications at this time.  Do NOT smoke the day of surgery.  Do NOT wear jewelry (body piercing), metal hair clips/bobby pins, make-up, or nail polish. Do NOT wear lotions, powders, or perfumes.  You may wear deodorant. Do NOT shave for 48 hours prior to surgery. Do NOT bring valuables to the hospital. Contacts, dentures, or bridgework may not be worn into surgery.  Have a responsible adult drive you home and stay with you for 24 hours after your procedure  Bring a copy of your healthcare power of attorney and living will documents.  **Effective Friday, Jan. 12, 2018, [facility] will implement no hospital visitations from children age 11 and younger due to a steady increase in flu activity in our community and hospitals. **

## 2016-08-04 ENCOUNTER — Encounter (HOSPITAL_COMMUNITY): Payer: Self-pay

## 2016-08-04 ENCOUNTER — Encounter (HOSPITAL_COMMUNITY)
Admission: RE | Admit: 2016-08-04 | Discharge: 2016-08-04 | Disposition: A | Discharge status: Home / Self Care | Payer: Medicare HMO | Source: Ambulatory Visit | Attending: Family Medicine | Admitting: Family Medicine

## 2016-08-04 DIAGNOSIS — K219 Gastro-esophageal reflux disease without esophagitis: Secondary | ICD-10-CM | POA: Insufficient documentation

## 2016-08-04 DIAGNOSIS — E1140 Type 2 diabetes mellitus with diabetic neuropathy, unspecified: Secondary | ICD-10-CM | POA: Insufficient documentation

## 2016-08-04 DIAGNOSIS — F419 Anxiety disorder, unspecified: Secondary | ICD-10-CM | POA: Diagnosis not present

## 2016-08-04 DIAGNOSIS — Z01812 Encounter for preprocedural laboratory examination: Secondary | ICD-10-CM | POA: Diagnosis present

## 2016-08-04 DIAGNOSIS — I10 Essential (primary) hypertension: Secondary | ICD-10-CM | POA: Insufficient documentation

## 2016-08-04 DIAGNOSIS — R1900 Intra-abdominal and pelvic swelling, mass and lump, unspecified site: Secondary | ICD-10-CM | POA: Diagnosis not present

## 2016-08-04 HISTORY — DX: Gastro-esophageal reflux disease without esophagitis: K21.9

## 2016-08-04 HISTORY — DX: Other specified postprocedural states: Z98.890

## 2016-08-04 HISTORY — DX: Anxiety disorder, unspecified: F41.9

## 2016-08-04 HISTORY — DX: Nausea with vomiting, unspecified: R11.2

## 2016-08-04 LAB — COMPREHENSIVE METABOLIC PANEL
ALK PHOS: 74 U/L (ref 38–126)
ALT: 16 U/L (ref 14–54)
AST: 10 U/L — AB (ref 15–41)
Albumin: 4.3 g/dL (ref 3.5–5.0)
Anion gap: 10 (ref 5–15)
BILIRUBIN TOTAL: 0.7 mg/dL (ref 0.3–1.2)
BUN: 18 mg/dL (ref 6–20)
CALCIUM: 9.5 mg/dL (ref 8.9–10.3)
CHLORIDE: 94 mmol/L — AB (ref 101–111)
CO2: 23 mmol/L (ref 22–32)
CREATININE: 0.7 mg/dL (ref 0.44–1.00)
GFR calc Af Amer: >60 mL/min (ref >60)
Glucose, Bld: 247 mg/dL — ABNORMAL HIGH (ref 65–99)
Potassium: 3.9 mmol/L (ref 3.5–5.1)
Sodium: 127 mmol/L — ABNORMAL LOW (ref 135–145)
TOTAL PROTEIN: 8.1 g/dL (ref 6.5–8.1)

## 2016-08-04 NOTE — Pre-Procedure Instructions (Signed)
Dr. Marcie Bal made aware of elevated glucose and decreased sodium level on CMP results today at pre op appointment.  No new orders were received at this time. I also sent a message via [facility] to Dr. Kennon Rounds to also make her aware.

## 2016-08-09 ENCOUNTER — Encounter (HOSPITAL_COMMUNITY): Payer: Self-pay

## 2016-08-09 ENCOUNTER — Ambulatory Visit (HOSPITAL_COMMUNITY)
Admission: RE | Admit: 2016-08-09 | Discharge: 2016-08-09 | Disposition: A | Discharge status: Home / Self Care | Payer: Medicare HMO | Source: Ambulatory Visit | Attending: Family Medicine | Admitting: Family Medicine

## 2016-08-09 ENCOUNTER — Ambulatory Visit (HOSPITAL_COMMUNITY): Payer: Medicare HMO | Admitting: Certified Registered Nurse Anesthetist

## 2016-08-09 ENCOUNTER — Encounter (HOSPITAL_COMMUNITY)
Admission: RE | Disposition: A | Discharge status: Home / Self Care | Payer: Self-pay | Source: Ambulatory Visit | Attending: Family Medicine

## 2016-08-09 DIAGNOSIS — Z7982 Long term (current) use of aspirin: Secondary | ICD-10-CM | POA: Insufficient documentation

## 2016-08-09 DIAGNOSIS — Z881 Allergy status to other antibiotic agents status: Secondary | ICD-10-CM | POA: Diagnosis not present

## 2016-08-09 DIAGNOSIS — Z888 Allergy status to other drugs, medicaments and biological substances status: Secondary | ICD-10-CM | POA: Insufficient documentation

## 2016-08-09 DIAGNOSIS — E119 Type 2 diabetes mellitus without complications: Secondary | ICD-10-CM

## 2016-08-09 DIAGNOSIS — Z6837 Body mass index (BMI) 37.0-37.9, adult: Secondary | ICD-10-CM | POA: Insufficient documentation

## 2016-08-09 DIAGNOSIS — F419 Anxiety disorder, unspecified: Secondary | ICD-10-CM | POA: Insufficient documentation

## 2016-08-09 DIAGNOSIS — N839 Noninflammatory disorder of ovary, fallopian tube and broad ligament, unspecified: Secondary | ICD-10-CM | POA: Diagnosis present

## 2016-08-09 DIAGNOSIS — M5430 Sciatica, unspecified side: Secondary | ICD-10-CM | POA: Diagnosis not present

## 2016-08-09 DIAGNOSIS — I10 Essential (primary) hypertension: Secondary | ICD-10-CM | POA: Diagnosis present

## 2016-08-09 DIAGNOSIS — R1900 Intra-abdominal and pelvic swelling, mass and lump, unspecified site: Secondary | ICD-10-CM

## 2016-08-09 DIAGNOSIS — K219 Gastro-esophageal reflux disease without esophagitis: Secondary | ICD-10-CM | POA: Diagnosis not present

## 2016-08-09 DIAGNOSIS — M1990 Unspecified osteoarthritis, unspecified site: Secondary | ICD-10-CM | POA: Insufficient documentation

## 2016-08-09 DIAGNOSIS — D271 Benign neoplasm of left ovary: Secondary | ICD-10-CM | POA: Diagnosis not present

## 2016-08-09 DIAGNOSIS — E669 Obesity, unspecified: Secondary | ICD-10-CM | POA: Insufficient documentation

## 2016-08-09 DIAGNOSIS — Z882 Allergy status to sulfonamides status: Secondary | ICD-10-CM | POA: Diagnosis not present

## 2016-08-09 DIAGNOSIS — Z720 Tobacco use: Secondary | ICD-10-CM | POA: Diagnosis not present

## 2016-08-09 DIAGNOSIS — Z7984 Long term (current) use of oral hypoglycemic drugs: Secondary | ICD-10-CM | POA: Insufficient documentation

## 2016-08-09 DIAGNOSIS — Z91041 Radiographic dye allergy status: Secondary | ICD-10-CM | POA: Insufficient documentation

## 2016-08-09 LAB — GLUCOSE, CAPILLARY
GLUCOSE-CAPILLARY: 254 mg/dL — AB (ref 65–99)
GLUCOSE-CAPILLARY: 271 mg/dL — AB (ref 65–99)

## 2016-08-09 SURGERY — OOPHORECTOMY, LAPAROSCOPIC
Anesthesia: General | Site: Abdomen | Laterality: Left

## 2016-08-09 MED ORDER — LACTATED RINGERS IV SOLN
INTRAVENOUS | Status: DC
  Administered 2016-08-09: 12:00:00 via INTRAVENOUS

## 2016-08-09 MED ORDER — PROMETHAZINE HCL 25 MG/ML IJ SOLN: 6.2500 mg | INTRAMUSCULAR | Status: DC | PRN

## 2016-08-09 MED ORDER — HYDROMORPHONE HCL 1 MG/ML IJ SOLN: 0.2500 mg | INTRAMUSCULAR | Status: DC | PRN

## 2016-08-09 MED ORDER — METFORMIN HCL 500 MG PO TABS
1000.0000 mg | ORAL_TABLET | Freq: Once | ORAL | Status: AC
  Administered 2016-08-09: 1000 mg via ORAL
  Filled 2016-08-09: qty 2

## 2016-08-09 MED ORDER — KETOROLAC TROMETHAMINE 30 MG/ML IJ SOLN: 30.0000 mg | Freq: Once | INTRAMUSCULAR | Status: DC

## 2016-08-09 MED ORDER — ACETAMINOPHEN 10 MG/ML IV SOLN
1000.0000 mg | Freq: Once | INTRAVENOUS | Status: AC
  Administered 2016-08-09: 1000 mg via INTRAVENOUS
  Filled 2016-08-09: qty 100

## 2016-08-09 MED ORDER — FENTANYL CITRATE (PF) 250 MCG/5ML IJ SOLN
INTRAMUSCULAR | Status: AC
  Filled 2016-08-09: qty 5

## 2016-08-09 MED ORDER — BUPIVACAINE HCL (PF) 0.25 % IJ SOLN
INTRAMUSCULAR | Status: AC
  Filled 2016-08-09: qty 30

## 2016-08-09 MED ORDER — KETOROLAC TROMETHAMINE 30 MG/ML IJ SOLN
INTRAMUSCULAR | Status: AC
  Filled 2016-08-09: qty 1

## 2016-08-09 MED ORDER — LIDOCAINE HCL (CARDIAC) 20 MG/ML IV SOLN
INTRAVENOUS | Status: DC | PRN
  Administered 2016-08-09: 80 mg via INTRAVENOUS

## 2016-08-09 MED ORDER — ONDANSETRON HCL 4 MG/2ML IJ SOLN
INTRAMUSCULAR | Status: DC | PRN
  Administered 2016-08-09: 4 mg via INTRAVENOUS

## 2016-08-09 MED ORDER — SCOPOLAMINE 1 MG/3DAYS TD PT72
MEDICATED_PATCH | TRANSDERMAL | Status: AC
  Filled 2016-08-09: qty 1

## 2016-08-09 MED ORDER — LABETALOL HCL 5 MG/ML IV SOLN
INTRAVENOUS | Status: AC
  Filled 2016-08-09: qty 4

## 2016-08-09 MED ORDER — ONDANSETRON HCL 4 MG/2ML IJ SOLN
INTRAMUSCULAR | Status: AC
  Filled 2016-08-09: qty 2

## 2016-08-09 MED ORDER — LIDOCAINE HCL (CARDIAC) 20 MG/ML IV SOLN
INTRAVENOUS | Status: AC
  Filled 2016-08-09: qty 5

## 2016-08-09 MED ORDER — PROPOFOL 10 MG/ML IV BOLUS
INTRAVENOUS | Status: DC | PRN
  Administered 2016-08-09: 150 mg via INTRAVENOUS
  Administered 2016-08-09: 50 mg via INTRAVENOUS

## 2016-08-09 MED ORDER — PROPOFOL 10 MG/ML IV BOLUS
INTRAVENOUS | Status: AC
  Filled 2016-08-09: qty 20

## 2016-08-09 MED ORDER — SUGAMMADEX SODIUM 200 MG/2ML IV SOLN
INTRAVENOUS | Status: AC
  Filled 2016-08-09: qty 2

## 2016-08-09 MED ORDER — SUGAMMADEX SODIUM 200 MG/2ML IV SOLN
INTRAVENOUS | Status: DC | PRN
  Administered 2016-08-09: 178.8 mg via INTRAVENOUS

## 2016-08-09 MED ORDER — OXYCODONE-ACETAMINOPHEN 5-325 MG PO TABS: 1.0000 | ORAL_TABLET | Freq: Four times a day (QID) | ORAL | 0 refills | Status: DC | PRN

## 2016-08-09 MED ORDER — LABETALOL HCL 5 MG/ML IV SOLN
INTRAVENOUS | Status: DC | PRN
  Administered 2016-08-09 (×2): 5 mg via INTRAVENOUS

## 2016-08-09 MED ORDER — BUPIVACAINE HCL (PF) 0.25 % IJ SOLN
INTRAMUSCULAR | Status: DC | PRN
  Administered 2016-08-09: 3 mL
  Administered 2016-08-09: 7 mL
  Administered 2016-08-09: 3 mL

## 2016-08-09 MED ORDER — MIDAZOLAM HCL 2 MG/2ML IJ SOLN
INTRAMUSCULAR | Status: AC
  Filled 2016-08-09: qty 2

## 2016-08-09 MED ORDER — ROCURONIUM BROMIDE 100 MG/10ML IV SOLN
INTRAVENOUS | Status: AC
  Filled 2016-08-09: qty 1

## 2016-08-09 MED ORDER — SCOPOLAMINE 1 MG/3DAYS TD PT72
1.0000 | MEDICATED_PATCH | Freq: Once | TRANSDERMAL | Status: DC
  Administered 2016-08-09: 1.5 mg via TRANSDERMAL

## 2016-08-09 MED ORDER — ROCURONIUM BROMIDE 100 MG/10ML IV SOLN
INTRAVENOUS | Status: DC | PRN
  Administered 2016-08-09: 50 mg via INTRAVENOUS
  Administered 2016-08-09: 700 mg via INTRAVENOUS

## 2016-08-09 MED ORDER — MIDAZOLAM HCL 2 MG/2ML IJ SOLN
INTRAMUSCULAR | Status: DC | PRN
  Administered 2016-08-09: 2 mg via INTRAVENOUS

## 2016-08-09 MED ORDER — EPHEDRINE SULFATE 50 MG/ML IJ SOLN
INTRAMUSCULAR | Status: DC | PRN
  Administered 2016-08-09 (×3): 5 mg via INTRAVENOUS

## 2016-08-09 MED ORDER — MEPERIDINE HCL 25 MG/ML IJ SOLN: 6.2500 mg | INTRAMUSCULAR | Status: DC | PRN

## 2016-08-09 MED ORDER — EPHEDRINE 5 MG/ML INJ
INTRAVENOUS | Status: AC
  Filled 2016-08-09: qty 10

## 2016-08-09 MED ORDER — FENTANYL CITRATE (PF) 100 MCG/2ML IJ SOLN
INTRAMUSCULAR | Status: DC | PRN
  Administered 2016-08-09 (×2): 100 ug via INTRAVENOUS

## 2016-08-09 SURGICAL SUPPLY — 26 items
CABLE HIGH FREQUENCY MONO STRZ (ELECTRODE) IMPLANT
CATH ROBINSON RED A/P 16FR (CATHETERS) ×2 IMPLANT
CLOTH BEACON ORANGE TIMEOUT ST (SAFETY) ×2 IMPLANT
DRSG OPSITE POSTOP 3X4 (GAUZE/BANDAGES/DRESSINGS) ×2 IMPLANT
DURAPREP 26ML APPLICATOR (WOUND CARE) ×2 IMPLANT
GLOVE BIOGEL PI IND STRL 7.0 (GLOVE) ×2 IMPLANT
GLOVE BIOGEL PI INDICATOR 7.0 (GLOVE) ×2
GLOVE ECLIPSE 7.0 STRL STRAW (GLOVE) ×4 IMPLANT
GOWN STRL REUS W/TWL LRG LVL3 (GOWN DISPOSABLE) ×6 IMPLANT
NS IRRIG 1000ML POUR BTL (IV SOLUTION) ×2 IMPLANT
PACK LAPAROSCOPY BASIN (CUSTOM PROCEDURE TRAY) ×2 IMPLANT
PACK TRENDGUARD 450 HYBRID PRO (MISCELLANEOUS) ×1 IMPLANT
POUCH SPECIMEN RETRIEVAL 10MM (ENDOMECHANICALS) ×2 IMPLANT
PROTECTOR NERVE ULNAR (MISCELLANEOUS) ×4 IMPLANT
SET IRRIG TUBING LAPAROSCOPIC (IRRIGATION / IRRIGATOR) IMPLANT
SHEARS HARMONIC ACE PLUS 36CM (ENDOMECHANICALS) ×2 IMPLANT
SUT VIC AB 3-0 X1 27 (SUTURE) ×2 IMPLANT
SUT VICRYL 0 UR6 27IN ABS (SUTURE) ×4 IMPLANT
SUT VICRYL 4-0 PS2 18IN ABS (SUTURE) ×2 IMPLANT
TOWEL OR 17X24 6PK STRL BLUE (TOWEL DISPOSABLE) ×4 IMPLANT
TRAY FOLEY CATH SILVER 14FR (SET/KITS/TRAYS/PACK) ×2 IMPLANT
TRENDGUARD 450 HYBRID PRO PACK (MISCELLANEOUS) ×2
TROCAR BALLN 12MMX100 BLUNT (TROCAR) ×2 IMPLANT
TROCAR OPTI TIP 5M 100M (ENDOMECHANICALS) ×4 IMPLANT
WARMER LAPAROSCOPE (MISCELLANEOUS) ×2 IMPLANT
WATER STERILE IRR 1000ML POUR (IV SOLUTION) ×2 IMPLANT

## 2016-08-09 NOTE — Transfer of Care (Signed)
Immediate Anesthesia Transfer of Care Note  Patient: Sarah Webster  Procedure(s) Performed: Procedure(s): LAPAROSCOPIC OOPHORECTOMY (Left)  Patient Location: PACU  Anesthesia Type:General  Level of Consciousness: awake, alert  and oriented  Airway & Oxygen Therapy: Patient Spontanous Breathing and Patient connected to nasal cannula oxygen  Post-op Assessment: Report given to RN, Post -op Vital signs reviewed and stable and Patient moving all extremities  Post vital signs: Reviewed and stable  Last Vitals:  Vitals:   08/09/16 1051  BP: 132/72  Pulse: 70  Resp: (!) 22  Temp: 36.6 C    Last Pain:  Vitals:   08/09/16 1051  TempSrc: Oral      Patients Stated Pain Goal: 3 ([id number])  Complications: No apparent anesthesia complications

## 2016-08-09 NOTE — Op Note (Signed)
PROCEDURE DATE: 08/09/2016  PREOPERATIVE DIAGNOSES: ovarian mass  POSTOPERATIVE DIAGNOSES: The same   PROCEDURE: Laparoscopic left oophorectomy   SURGEON: Dr. Donnamae Jude   ASSISTANT: Mora Bellman, MD  ANESTHESIOLOGIST: Lyn Hollingshead, MD MD - [facility]  INDICATIONS: 61 y.o. 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 Right ovary not seen with confidence. Left ovary with large hard mass noted   ESTIMATED BLOOD LOSS: 100 ml   SPECIMENS: Left ovary  COMPLICATIONS: None immediately known   PROCEDURE IN DETAIL: The patient had sequential compression devices applied to her lower extremities while in the preoperative area. She was then taken to the operating room where general anesthesia was administered and was found to be adequate. She was placed in the dorsal lithotomy position, and was prepped and draped in a sterile manner. A Foley catheter was inserted into her bladder and attached to constant drainage and a sponge on a stick placed inside the vagina. After an adequate timeout was performed, attention was then turned to the patient's abdomen where a 11-mm skin incision was made above the umbilicus.  This was carried down to the underlying fascia and peritoneum.  The fascia was tagged with 0 Vicryl suture on a UR-6. The [place] placed. Intraperitoneal placement was confirmed and insufflation done. A survey of the patient's pelvis and abdomen revealed the findings above. Bilateral 5-mm lower quadrant ports were then placed under direct visualization. The left ovary was grasped with a tenaculum.  The infundibulopelvic was then clamped and transected with the Harmonic device. Blunt and sharp dissection was used to remove the tube and ovary. The ureter was identified inferior to the operative field. Excellent hemostasis was noted. On the right side, the infundibulopelvic was then clamped and transected with  the Harmonic device. Blunt and sharp dissection was used to remove the ovary with care being taken to stay very close to the specimen.   The specimen was then placed in an Endocatch bag. It was brought up to the 11 mm port at the umbilicus. This incision was extended to allow removal of the specimen. At some point the bag no longer contained the specimen. The specimen was removed in pieces. Once the specimen was out the [place] replaced and the site under the port inspected and found to be free of any ovarian debris.  The operative site was surveyed, and found to be hemostatic. No intraoperative injury to other surrounding organs was noted. The abdomen was desufflated and all instruments were then removed from the patient's abdomen. Fascial closure of about 2.5 cm was needed and closed with 0 Vicryl. Subcuticular layer closed with same vicryl. . All skin incisions were closed with 3-0 Vicryl subcuticular stitches/Dermabond. All instrument, needle and lap counts correct x 2. Infant awakened and taken to recovery in stable condition.  Donnamae Jude MD 08/09/2016 1:53 PM

## 2016-08-09 NOTE — Discharge Instructions (Addendum)
Diagnostic Laparoscopy, Care After Introduction Refer to this sheet in the next few weeks. These instructions provide you with information about caring for yourself after your procedure. Your health care provider may also give you more specific instructions. Your treatment has been planned according to current medical practices, but problems sometimes occur. Call your health care provider if you have any problems or questions after your procedure. What can I expect after the procedure? After your procedure, it is common to have mild discomfort in the throat and abdomen. Follow these instructions at home:  Take over-the-counter and prescription medicines only as told by your health care provider.  Do not drive for 24 hours if you received a sedative.  Return to your normal activities as told by your health care provider.  Do not take baths, swim, or use a hot tub until your health care provider approves. You may shower.  Follow instructions from your health care provider about how to take care of your incision. Make sure you:  Wash your hands with soap and water before you change your bandage (dressing). If soap and water are not available, use hand sanitizer.  Change your dressing as told by your health care provider.  Leave stitches (sutures), skin glue, or adhesive strips in place. These skin closures may need to stay in place for 2 weeks or longer. If adhesive strip edges start to loosen and curl up, you may trim the loose edges. Do not remove adhesive strips completely unless your health care provider tells you to do that.  Check your incision area every day for signs of infection. Check for:  More redness, swelling, or pain.  More fluid or blood.  Warmth.  Pus or a bad smell.  It is your responsibility to get the results of your procedure. Ask your health care provider or the department performing the procedure when your results will be ready. Contact a health care provider  if:  There is new pain in your shoulders.  You feel light-headed or faint.  You are unable to pass gas or unable to have a bowel movement.  You feel nauseous or you vomit.  You develop a rash.  You have more redness, swelling, or pain around your incision.  You have more fluid or blood coming from your incision.  Your incision feels warm to the touch.  You have pus or a bad smell coming from your incision.  You have a fever or chills. Get help right away if:  Your pain is getting worse.  You have ongoing vomiting.  The edges of your incision open up.  You have trouble breathing.  You have chest pain. This information is not intended to replace advice given to you by your health care provider. Make sure you discuss any questions you have with your health care provider. Document Released: 06/01/2015 Document Revised: 11/26/2015 Document Reviewed: 03/03/2015  2017 Elsevier  DISCHARGE INSTRUCTIONS: Laparoscopy  The following instructions have been prepared to help you care for yourself upon your return home today.  Wound care:  Do not get the incision wet for the first 24 hours. The incision should be kept clean and dry.  The Band-Aids or dressings may be removed the 2 days after surgery.  Should the incision become sore, red, and swollen after the first week, check with your doctor.  Personal hygiene:  Shower the day after your procedure.  Activity and limitations:  Do NOT drive or operate any equipment today.  Do NOT lift anything more  than 15 pounds for 2-3 weeks after surgery.  Do NOT rest in bed all day.  Walking is encouraged. Walk each day, starting slowly with 5-minute walks 3 or 4 times a day. Slowly increase the length of your walks.  Walk up and down stairs slowly.  Do NOT do strenuous activities, such as golfing, playing tennis, bowling, running, biking, weight lifting, gardening, mowing, or vacuuming for 2-4 weeks. Ask your doctor when it is  okay to start.  Diet: Eat a light meal as desired this evening. You may resume your usual diet tomorrow.  Return to work: This is dependent on the type of work you do. For the most part you can return to a desk job within a week of surgery. If you are more active at work, please discuss this with your doctor.  What to expect after your surgery: You may have a slight burning sensation when you urinate on the first day. You may have a very small amount of blood in the urine. Expect to have a small amount of vaginal discharge/light bleeding for 1-2 weeks. It is not unusual to have abdominal soreness and bruising for up to 2 weeks. You may be tired and need more rest for about 1 week. You may experience shoulder pain for 24-72 hours. Lying flat in bed may relieve it.  Call your doctor for any of the following:  Develop a fever of 100.4 or greater  Inability to urinate 6 hours after discharge from hospital  Severe pain not relieved by pain medications  Persistent of heavy bleeding at incision site  Redness or swelling around incision site after a week  Increasing nausea or vomiting  Patient Signature________________________________________ Nurse Signature_________________________________________   Post Anesthesia Home Care Instructions  Activity: Get plenty of rest for the remainder of the day. A responsible adult should stay with you for 24 hours following the procedure.  For the next 24 hours, DO NOT: -Drive a car -Paediatric nurse -Drink alcoholic beverages -Take any medication unless instructed by your physician -Make any legal decisions or sign important papers.  Meals: Start with liquid foods such as gelatin or soup. Progress to regular foods as tolerated. Avoid greasy, spicy, heavy foods. If nausea and/or vomiting occur, drink only clear liquids until the nausea and/or vomiting subsides. Call your physician if vomiting continues.  Special Instructions/Symptoms: Your throat  may feel dry or sore from the anesthesia or the breathing tube placed in your throat during surgery. If this causes discomfort, gargle with warm salt water. The discomfort should disappear within 24 hours.  If you had a scopolamine patch placed behind your ear for the management of post- operative nausea and/or vomiting:  1. The medication in the patch is effective for 72 hours, after which it should be removed.  Wrap patch in a tissue and discard in the trash. Wash hands thoroughly with soap and water. 2. You may remove the patch earlier than 72 hours if you experience unpleasant side effects which may include dry mouth, dizziness or visual disturbances. 3. Avoid touching the patch. Wash your hands with soap and water after contact with the patch.

## 2016-08-09 NOTE — Anesthesia Procedure Notes (Signed)
Procedure Name: Intubation Date/Time: 08/09/2016 12:24 PM Performed by: Hewitt Blade Pre-anesthesia Checklist: Patient identified, Emergency Drugs available, Suction available and Patient being monitored Patient Re-evaluated:Patient Re-evaluated prior to inductionOxygen Delivery Method: Circle system utilized Preoxygenation: Pre-oxygenation with 100% oxygen Intubation Type: IV induction Ventilation: Two handed mask ventilation required Laryngoscope Size: Mac and 3 Grade View: Grade I Tube type: Oral Tube size: 7.0 mm Number of attempts: 1 Airway Equipment and Method: Stylet Placement Confirmation: ETT inserted through vocal cords under direct vision,  positive ETCO2 and breath sounds checked- equal and bilateral Secured at: 23 cm Tube secured with: Tape Dental Injury: Teeth and Oropharynx as per pre-operative assessment

## 2016-08-09 NOTE — Anesthesia Preprocedure Evaluation (Addendum)
Anesthesia Evaluation  Patient identified by MRN, date of birth, ID band Patient awake    Reviewed: Allergy & Precautions, NPO status , Patient's Chart, lab work & pertinent test results  Airway Mallampati: II       Dental no notable dental hx.    Pulmonary    Pulmonary exam normal        Cardiovascular hypertension, Pt. on medications Normal cardiovascular exam     Neuro/Psych    GI/Hepatic   Endo/Other  diabetes, Type 2, Oral Hypoglycemic Agents  Renal/GU      Musculoskeletal   Abdominal (+) + obese,   Peds  Hematology   Anesthesia Other Findings   Reproductive/Obstetrics                             Anesthesia Physical Anesthesia Plan  ASA: II  Anesthesia Plan: General   Post-op Pain Management:    Induction: Intravenous  Airway Management Planned: Oral ETT  Additional Equipment:   Intra-op Plan:   Post-operative Plan: Extubation in OR  Informed Consent: I have reviewed the patients History and Physical, chart, labs and discussed the procedure including the risks, benefits and alternatives for the proposed anesthesia with the patient or authorized representative who has indicated his/her understanding and acceptance.   Dental advisory given  Plan Discussed with: CRNA and Surgeon  Anesthesia Plan Comments:         Anesthesia Quick Evaluation

## 2016-08-09 NOTE — Anesthesia Postprocedure Evaluation (Addendum)
Anesthesia Post Note  Patient: Sarah Webster  Procedure(s) Performed: Procedure(s) (LRB): LAPAROSCOPIC OOPHORECTOMY (Left)  Patient location during evaluation: PACU Anesthesia Type: General Level of consciousness: sedated Pain management: pain level controlled Vital Signs Assessment: post-procedure vital signs reviewed and stable Respiratory status: spontaneous breathing Cardiovascular status: stable Postop Assessment: no signs of nausea or vomiting Anesthetic complications: no        Last Vitals:  Vitals:   08/09/16 1445 08/09/16 1509  BP:    Pulse: 84 80  Resp: 12 17  Temp:      Last Pain:  Vitals:   08/09/16 1445  TempSrc:   PainSc: Asleep   Pain Goal: Patients Stated Pain Goal: 3 (08/09/16 1445)               [name]

## 2016-08-09 NOTE — H&P (Signed)
Sarah Webster is an 61 y.o. G4P4 female.   Chief Complaint: right sided abdominal pain HPI: Has left pelvic mass. 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 Has some sciatica and right sided pain which prompted initial u/s and f/u MRI. CA 125 is normal. Patient strongly desires removal and most of theses ovarian neoplasms should be removed.  Past Medical History:  Diagnosis Date  . Anxiety   . Arthritis   . Diabetes mellitus   . GERD (gastroesophageal reflux disease)   . Hypertension   . PONV (postoperative nausea and vomiting)     Past Surgical History:  Procedure Laterality Date  . ABDOMINAL HYSTERECTOMY    . CARPAL TUNNEL RELEASE    . HAND SURGERY      Family History  Problem Relation Age of Onset  . Cancer Sister     lung  . Diabetes Sister   . Hypertension Sister    Social History:  reports that she has never smoked. Her smokeless tobacco use includes Chew. She reports that she does not drink alcohol or use drugs.  Allergies:  Allergies  Allergen Reactions  . Contrast Media [Iodinated Diagnostic Agents] Other (See Comments)    "hallucinations"  . Levaquin [Levofloxacin] Anaphylaxis, Hives, Shortness Of Breath and Itching  . Other Anaphylaxis and Shortness Of Breath    Tar - Paving new roads   . Ponstel [Mefenamic Acid] Other (See Comments)    "abnormal behavior"  . Sulfa Antibiotics Anaphylaxis and Swelling    Throat and face   . Yellow Dyes (Non-Tartrazine) Anaphylaxis  . Ciprofloxacin Hives    No current facility-administered medications on file prior to encounter.    Current Outpatient Prescriptions on File Prior to Encounter  Medication Sig Dispense Refill  . ALPRAZolam (XANAX) 0.25 MG tablet Take 0.25 mg by mouth 2 (two) times daily as needed for anxiety.     Marland Kitchen aspirin 81 MG tablet Take 81 mg by mouth daily.    . Cholecalciferol (VITAMIN D3) 5000 units CAPS Take 1 capsule by mouth daily.    . Cyanocobalamin (VITAMIN B-12) 3000 MCG SUBL Place 1 tablet  under the tongue daily.    . Dapagliflozin Propanediol (FARXIGA) 5 MG TABS Take 10 mg by mouth daily.     Marland Kitchen ezetimibe-simvastatin (VYTORIN) 10-20 MG per tablet Take 1 tablet by mouth every morning.    . gabapentin (NEURONTIN) 300 MG capsule Take 300-600 mg by mouth 2 (two) times daily.    Marland Kitchen HYDROcodone-acetaminophen (NORCO/VICODIN) 5-325 MG per tablet Take 1 tablet by mouth 2 (two) times daily as needed for moderate pain.   0  . metFORMIN (GLUCOPHAGE) 1000 MG tablet Take 1,000 mg by mouth 2 (two) times daily with a meal.    . omeprazole (PRILOSEC) 20 MG capsule Take 20 mg by mouth 2 (two) times daily.  1  . valsartan-hydrochlorothiazide (DIOVAN-HCT) 160-12.5 MG per tablet Take 1 tablet by mouth daily.  0  . nystatin cream (MYCOSTATIN) Apply to affected area 2 times daily (Patient taking differently: Apply 1 application topically daily as needed for dry skin. ) 15 g 0    Pertinent items are noted in HPI.  There were no vitals taken for this visit. General appearance: alert, cooperative and appears older than stated age Head: Normocephalic, without obvious abnormality, atraumatic Neck: supple, symmetrical, trachea midline Lungs: normal effort Heart: regular rate and rhythm Abdomen: soft, non-tender; bowel sounds normal; no masses,  no organomegaly Extremities: extremities normal, atraumatic, no cyanosis or edema Skin: Skin color, texture,  turgor normal. No rashes or lesions Neurologic: Grossly normal   Lab Results  Component Value Date   WBC 8.0 07/28/2016   HGB 13.1 07/28/2016   HCT 38.8 07/28/2016   MCV 79.7 07/28/2016   PLT 355 07/28/2016   CMP Latest Ref Rng & Units 08/04/2016 07/28/2016 04/16/2016  Glucose 65 - 99 mg/dL 247(H) 290(H) 209(H)  BUN 6 - 20 mg/dL 18 13 13   Creatinine 0.44 - 1.00 mg/dL 0.70 0.74 0.72  Sodium 135 - 145 mmol/L 127(L) 132(L) 138  Potassium 3.5 - 5.1 mmol/L 3.9 3.8 3.9  Chloride 101 - 111 mmol/L 94(L) 97(L) 102  CO2 22 - 32 mmol/L 23 22 25   Calcium 8.9  - 10.3 mg/dL 9.5 10.0 9.8  Total Protein 6.5 - 8.1 g/dL 8.1 7.7 7.3  Total Bilirubin 0.3 - 1.2 mg/dL 0.7 0.3 0.3  Alkaline Phos 38 - 126 U/L 74 73 69  AST 15 - 41 U/L 10(L) 14(L) 11(L)  ALT 14 - 54 U/L 16 17 18     Assessment/Plan Principal Problem:   Pelvic mass in female Active Problems:   Diabetes mellitus (HCC)   Hypertension  For laparoscopic oophorectomy Has medical clearance from PCP on chart. Glycemic control is sub-optimal, but not an open procedure. Risks include but are not limited to bleeding, infection, injury to surrounding structures, including bowel, bladder and ureters, blood clots, and death.  Likelihood of success is high.    Donnamae Jude 08/09/2016, 10:50 AM

## 2016-08-24 ENCOUNTER — Ambulatory Visit: Payer: Medicare HMO | Admitting: Obstetrics & Gynecology

## 2016-08-24 ENCOUNTER — Encounter: Payer: Self-pay | Admitting: Obstetrics & Gynecology

## 2016-08-24 VITALS — BP 122/73 | HR 92 | Ht 61.0 in | Wt 199.0 lb

## 2016-08-24 DIAGNOSIS — Z9889 Other specified postprocedural states: Secondary | ICD-10-CM

## 2016-08-24 DIAGNOSIS — T814XXA Infection following a procedure, initial encounter: Principal | ICD-10-CM

## 2016-08-24 DIAGNOSIS — IMO0001 Reserved for inherently not codable concepts without codable children: Secondary | ICD-10-CM

## 2016-08-24 MED ORDER — DICLOXACILLIN SODIUM 500 MG PO CAPS: 500.0000 mg | ORAL_CAPSULE | Freq: Two times a day (BID) | ORAL | 0 refills | Status: DC

## 2016-08-24 NOTE — Progress Notes (Signed)
History:  61 y.o. G4P4 here today for post op check. Pt is s/p lap left oophorectomy for ovarian mass on 08/09/16. Pt reports pain in her abd. She also reports that she had pain in her should after the scopolamine patch was placed. She denies fever or chills. She was told that she could not shower and so has not cleaned the incision.  She denies fever or chills.   The following portions of the patient's history were reviewed and updated as appropriate: allergies, current medications, past family history, past medical history, past social history, past surgical history and problem list.  Review of Systems:  Pertinent items are noted in HPI.   Objective:  Physical Exam Blood pressure 122/73, pulse 92, height 5\' 1"  (1.549 m), weight 199 lb (90.3 kg). Gen: NAD Abd: Soft, nontender and nondistended- the umbilical lincision is caked with a yellow fluid and caked purulent drainage although I am unable to express any pus. There is a small amount to of induration above the incision. This area is nontender.  There is no warmth or erythema  Labs and Imaging 08/09/2016 Diagnosis Ovary, left - BENIGN BRENNER CELL TUMOR, SEE COMMENT.  Assessment & Plan:  2 week post op check superficial skin incision ina diabetic pt Dicloxacillin 500 mg bid x 7 day Reviewed with pt how to clean incision and to keep incision dry. She may shower F/u 09/12/2016 with Dr. Kennon Rounds

## 2016-08-24 NOTE — Patient Instructions (Signed)
Surgical Site Infections FAQs What is a Surgical Site Infection (SSI)?  A surgical site infection is an infection that occurs after surgery in the part of the body where the surgery took place. Most patients who have surgery do not develop an infection. However, infections develop in about 1 to 3 out of every 100 patients who have surgery. Some of the common symptoms of a surgical site infection are:  Redness and pain around the area where you had surgery  Drainage of cloudy fluid from your surgical wound  Fever Can SSIs be treated?  Yes. Most surgical site infections can be treated with antibiotics. The antibiotic given to you depends on the bacteria (germs) causing the infection. Sometimes patients with SSIs also need another surgery to treat the infection. What are some of the things that hospitals are doing to prevent SSIs?  To prevent SSIs, doctors, nurses, and other healthcare providers:  Clean their hands and arms up to their elbows with an antiseptic agent just before the surgery.  Clean their hands with soap and water or an alcohol-based hand rub before and after caring for each patient.  May remove some of your hair immediately before your surgery using electric clippers if the hair is in the same area where the procedure will occur. They should not shave you with a razor.  Wear special hair covers, masks, gowns, and gloves during surgery to keep the surgery area clean.  Give you antibiotics before your surgery starts. In most cases, you should get antibiotics within 60 minutes before the surgery starts and the antibiotics should be stopped within 24 hours after surgery.  Clean the skin at the site of your surgery with a special soap that kills germs. What can I do to help prevent SSIs?  Before your surgery:  Tell your doctor about other medical problems you may have. Health problems such as allergies, diabetes, and obesity could affect your surgery and your treatment.  Quit  smoking. Patients who smoke get more infections. Talk to your doctor about how you can quit before your surgery.  Do not shave near where you will have surgery. Shaving with a razor can irritate your skin and make it easier to develop an infection. At the time of your surgery:  Speak up if someone tries to shave you with a razor before surgery. Ask why you need to be shaved and talk with your surgeon if you have any concerns.  Ask if you will get antibiotics before surgery. After your surgery:  Make sure that your healthcare providers clean their hands before examining you, either with soap and water or an alcohol-based hand rub.  If you do not see your providers clean their hands, please ask them to do so.  Family and friends who visit you should not touch the surgical wound or dressings.  Family and friends should clean their hands with soap and water or an alcohol-based hand rub before and after visiting you. If you do not see them clean their hands, ask them to clean their hands. What do I need to do when I go home from the hospital?  Before you go home, your doctor or nurse should explain everything you need to know about taking care of your wound. Make sure you understand how to care for your wound before you leave the hospital.  Always clean your hands before and after caring for your wound.  Before you go home, make sure you know who to contact if you have  questions or problems after you get home.  If you have any symptoms of an infection, such as redness and pain at the surgery site, drainage, or fever, call your doctor immediately. If you have additional questions, please ask your doctor or nurse.  Developed and co-sponsored by [employer] for [place] [phone]); Infectious Diseases Society of [place] (IDSA); [place]; Association for Professionals in Infection Control and Epidemiology (APIC); Centers for Disease Control and Prevention  (CDC); and [employer].  This information is not intended to replace advice given to you by your health care provider. Make sure you discuss any questions you have with your health care provider. Document Released: 06/25/2013 Document Revised: 11/26/2015 Document Reviewed: 11/09/2015 Elsevier Interactive Patient Education  2017 [employer].

## 2016-09-02 ENCOUNTER — Encounter (HOSPITAL_COMMUNITY): Payer: Self-pay

## 2016-09-02 ENCOUNTER — Inpatient Hospital Stay (HOSPITAL_COMMUNITY)
Admission: AD | Admit: 2016-09-02 | Discharge: 2016-09-02 | Disposition: A | Discharge status: Home / Self Care | Payer: Medicare HMO | Source: Ambulatory Visit | Attending: Obstetrics and Gynecology | Admitting: Obstetrics and Gynecology

## 2016-09-02 DIAGNOSIS — E119 Type 2 diabetes mellitus without complications: Secondary | ICD-10-CM | POA: Insufficient documentation

## 2016-09-02 DIAGNOSIS — I10 Essential (primary) hypertension: Secondary | ICD-10-CM | POA: Insufficient documentation

## 2016-09-02 DIAGNOSIS — Z9889 Other specified postprocedural states: Secondary | ICD-10-CM | POA: Insufficient documentation

## 2016-09-02 DIAGNOSIS — Z7982 Long term (current) use of aspirin: Secondary | ICD-10-CM | POA: Insufficient documentation

## 2016-09-02 DIAGNOSIS — F17290 Nicotine dependence, other tobacco product, uncomplicated: Secondary | ICD-10-CM | POA: Diagnosis not present

## 2016-09-02 DIAGNOSIS — R109 Unspecified abdominal pain: Secondary | ICD-10-CM | POA: Diagnosis not present

## 2016-09-02 DIAGNOSIS — K219 Gastro-esophageal reflux disease without esophagitis: Secondary | ICD-10-CM | POA: Insufficient documentation

## 2016-09-02 DIAGNOSIS — Z7984 Long term (current) use of oral hypoglycemic drugs: Secondary | ICD-10-CM | POA: Insufficient documentation

## 2016-09-02 DIAGNOSIS — T8189XA Other complications of procedures, not elsewhere classified, initial encounter: Secondary | ICD-10-CM | POA: Diagnosis present

## 2016-09-02 DIAGNOSIS — T148XXA Other injury of unspecified body region, initial encounter: Secondary | ICD-10-CM | POA: Diagnosis not present

## 2016-09-02 DIAGNOSIS — F419 Anxiety disorder, unspecified: Secondary | ICD-10-CM | POA: Insufficient documentation

## 2016-09-02 DIAGNOSIS — L24A9 Irritant contact dermatitis due friction or contact with other specified body fluids: Secondary | ICD-10-CM

## 2016-09-02 NOTE — MAU Provider Note (Signed)
Chief Complaint:  Post-op Problem   First Provider Initiated Contact with Patient 09/02/16 1848       HPI: Sarah Webster is a 61 y.o. G4P4 who presents to maternity admissions reporting drainage at umbilical incision.  Had an oopherectomy on 08/09/16.  Was seen on 2/21 and wound was cleaned and packed.  Has been dressing it athome.   States still has a little drainage.  Tender to touch.  . She reports vaginal bleeding, vaginal itching/burning, urinary symptoms, h/a, dizziness, n/v, or fever/chills.    Other  This is a recurrent problem. The current episode started 1 to 4 weeks ago. The problem occurs constantly. The problem has been gradually improving. Associated symptoms include abdominal pain. Pertinent negatives include no chills, fatigue, fever, myalgias, nausea or vomiting. Exacerbated by: palpation. Treatments tried: wound dressing.   RN Note: Had surgery on the 6th.  Still having drainage at umbilicus. Tender touch. Denies fever. No GI or GU problems.  Past Medical History: Past Medical History:  Diagnosis Date  . Anxiety   . Arthritis   . Diabetes mellitus   . GERD (gastroesophageal reflux disease)   . Hypertension   . PONV (postoperative nausea and vomiting)     Past obstetric history: OB History  Gravida Para Term Preterm AB Living  4 4       3   SAB TAB Ectopic Multiple Live Births          4    # Outcome Date GA Lbr Len/2nd Weight Sex Delivery Anes PTL Lv  4 Para      Vag-Spont     3 Para      Vag-Spont     2 Para      Vag-Spont     1 Para      Vag-Spont         Past Surgical History: Past Surgical History:  Procedure Laterality Date  . ABDOMINAL HYSTERECTOMY    . CARPAL TUNNEL RELEASE    . HAND SURGERY      Family History: Family History  Problem Relation Age of Onset  . Cancer Sister     lung  . Diabetes Sister   . Hypertension Sister     Social History: Social History  Substance Use Topics  . Smoking status: Never Smoker  . Smokeless tobacco:  Current User    Types: Chew  . Alcohol use No    Allergies:  Allergies  Allergen Reactions  . Contrast Media [Iodinated Diagnostic Agents] Other (See Comments)    "hallucinations"  . Levaquin [Levofloxacin] Anaphylaxis, Hives, Shortness Of Breath and Itching  . Other Anaphylaxis and Shortness Of Breath    Tar - Paving new roads   . Ponstel [Mefenamic Acid] Other (See Comments)    "abnormal behavior"  . Sulfa Antibiotics Anaphylaxis and Swelling    Throat and face   . Yellow Dyes (Non-Tartrazine) Anaphylaxis  . Ciprofloxacin Hives    Meds:  Prescriptions Prior to Admission  Medication Sig Dispense Refill Last Dose  . ALPRAZolam (XANAX) 0.25 MG tablet Take 0.25 mg by mouth 2 (two) times daily as needed for anxiety.    Taking  . aspirin 81 MG tablet Take 81 mg by mouth daily.   Taking  . Cholecalciferol (VITAMIN D3) 5000 units CAPS Take 1 capsule by mouth daily.   Taking  . Cyanocobalamin (VITAMIN B-12) 3000 MCG SUBL Place 1 tablet under the tongue daily.   Taking  . Dapagliflozin Propanediol (FARXIGA) 5 MG TABS Take  10 mg by mouth daily.    Taking  . dicloxacillin (DYNAPEN) 500 MG capsule Take 1 capsule (500 mg total) by mouth 2 (two) times daily. 14 capsule 0   . ezetimibe-simvastatin (VYTORIN) 10-20 MG per tablet Take 1 tablet by mouth every morning.   Taking  . gabapentin (NEURONTIN) 300 MG capsule Take 300-600 mg by mouth 2 (two) times daily.   Taking  . HYDROcodone-acetaminophen (NORCO/VICODIN) 5-325 MG per tablet Take 1 tablet by mouth 2 (two) times daily as needed for moderate pain.   0 Not Taking  . metFORMIN (GLUCOPHAGE) 1000 MG tablet Take 1,000 mg by mouth 2 (two) times daily with a meal.   Taking  . nystatin cream (MYCOSTATIN) Apply to affected area 2 times daily (Patient taking differently: Apply 1 application topically daily as needed for dry skin. ) 15 g 0 Taking  . omeprazole (PRILOSEC) 20 MG capsule Take 20 mg by mouth 2 (two) times daily.  1 Taking  .  oxyCODONE-acetaminophen (PERCOCET/ROXICET) 5-325 MG tablet Take 1-2 tablets by mouth every 6 (six) hours as needed. (Patient not taking: Reported on 08/24/2016) 20 tablet 0 Not Taking  . valsartan-hydrochlorothiazide (DIOVAN-HCT) 160-12.5 MG per tablet Take 1 tablet by mouth daily.  0 Taking    I have reviewed patient's Past Medical Hx, Surgical Hx, Family Hx, Social Hx, medications and allergies.  ROS:  Review of Systems  Constitutional: Negative for chills, fatigue and fever.  Gastrointestinal: Positive for abdominal pain. Negative for nausea and vomiting.  Musculoskeletal: Negative for myalgias.   Other systems negative     Physical Exam  Patient Vitals for the past 24 hrs:  BP Temp Temp src Pulse Resp SpO2  09/02/16 1822 160/91 98.6 F (37 C) Oral 88 18 100 %   Constitutional: Well-developed, well-nourished female in no acute distress.  Cardiovascular: normal rate and rhythm, no ectopy audible, S1 & S2 heard, no murmur Respiratory: normal effort, no distress. Lungs CTAB with no wheezes or crackles GI: Abd soft, non-tender.  Nondistended.  No rebound, No guarding.  Bowel Sounds audible  Incision is healing well with good granulation tissue noted. NO drainage from wound.   MS: Extremities nontender, no edema, normal ROM Neurologic: Alert and oriented x 4.   Grossly nonfocal. GU: Neg CVAT. Skin:  Warm and Dry Psych:  Affect appropriate.    PELVIC EXAM:    Labs: No results found for this or any previous visit (from the past 24 hour(s)).    Imaging:  No results found.  MAU Course/MDM: I have ordered labs as follows:none   Imaging ordered: none   Treatments in MAU included Dressing changed.  Discussed does not need packing.  Dress for several more days then leave open to air. Reassured healing well. .   Pt stable at time of discharge.  Assessment: Wound healing well  Plan: Discharge home Recommend  Continue daily dressing, no packing needed  Encouraged to return  here or to other Urgent Care/ED if she develops worsening of symptoms, increase in pain, fever, or other concerning symptoms.   Hansel Feinstein CNM, MSN Certified Nurse-Midwife 09/02/2016 7:02 PM

## 2016-09-02 NOTE — Discharge Instructions (Signed)
How to Change Your Dressing A dressing is a material that is placed in and over wounds. A dressing helps your wound to heal by protecting it from:  Bacteria.  Worse injury.  Being too dry or too wet. What are the risks? The sticky (adhesive) tape that is used with a dressing may make your skin sore or irritated, or it may cause a rash. These are the most common problems. However, more serious problems can develop, such as:  Bleeding.  Infection. How to change your dressing Getting Ready to Change Your Dressing    Take a shower before you do the first dressing change of the day. If your doctor does not want your wound to get wet and your dressing is not waterproof, you may need to put plastic leak-proof sealing wrap on your dressing to protect it.  If needed, take pain medicine as told by your doctor 30 minutes before you change your dressing.  Set up a clean station for wound care. You will need:  A plastic trash bag that is open and ready to use.  Hand sanitizer.  Wound cleanser or salt-water solution (saline) as told by your doctor.  New dressing material or bandages. Make sure to open the dressing package so the dressing stays on the inside of the package. You may also need these supplies in your clean station:  A box of vinyl gloves.  Tape.  Skin protectant. This may be a wipe, film, or spray.  Clean or germ-free (sterile) scissors.  A cotton-tipped applicator. Taking Off Your Old Dressing   Wash your hands with soap and water. Dry your hands with a clean towel. If you cannot use soap and water, use hand sanitizer.  If you are using gloves, put on the gloves before you take off the dressing.  Gently take off any adhesive or tape by pulling it off in the direction of your hair growth. Only touch the outside edges of the dressing.  Take off the dressing. If the dressing sticks to your skin, wet the dressing with a germ-free salt-water solution. This helps it come  off more easily.  Take off any gauze or packing in your wound.  Throw the old dressing supplies into the ready trash bag.  Take off your gloves. To take off each glove, grab the cuff with your other hand and turn the glove inside out. Put the gloves in the trash right away.  Wash your hands with soap and water. Dry your hands with a clean towel. If you cannot use soap and water, use hand sanitizer. Cleaning Your Wound   Follow instructions from your doctor about how to clean your wound. This may include using a salt-water solution or recommended wound cleanser.  Do not use over-the-counter medicated or antiseptic creams, sprays, liquids, or dressings unless your doctor tells you to do that.  Use a clean gauze pad to clean the area fully with the salt-water solution or wound cleanser that your doctor recommends.  Throw the gauze pad into the trash bag.  Wash your hands with soap and water. Dry your hands with a clean towel. If you cannot use soap and water, use hand sanitizer. Putting on the Dressing   If your doctor recommended a skin protectant, put it on the skin around the wound.  Cover the wound with the recommended dressing, such as a nonstick gauze or bandage. Make sure to touch only the outside edges of the dressing. Do not touch the inside of the   dressing.  Attach the dressing so all sides stay in place. You may do this with the attached medical adhesive, roll gauze, or tape. If you use tape, do not wrap the tape all the way around your arm or leg.  Take off your gloves. Put them in the trash bag with the old dressing. Tie the bag shut and throw it away.  Wash your hands with soap and water. Dry your hands with a clean towel. If you cannot use soap and water, use hand sanitizer. Get help if:   You have new pain.  You have irritation, a rash, or itching around the wound or dressing.  Changing your dressing is painful.  Changing your dressing causes a lot of  bleeding. Get help right away if:  You have very bad pain.  You have signs of infection, such as:  More redness, swelling, or pain.  More fluid or blood.  Warmth.  Pus or a bad smell.  Red streaks leading from wound.  A fever. This information is not intended to replace advice given to you by your health care provider. Make sure you discuss any questions you have with your health care provider. Document Released: 09/16/2008 Document Revised: 11/26/2015 Document Reviewed: 03/26/2015 Elsevier Interactive Patient Education  2017 [employer]  

## 2016-09-02 NOTE — MAU Note (Signed)
Had surgery on the 6th.  Still having drainage at umbilicus. Tender touch. Denies fever. No GI or GU problems.

## 2016-09-12 ENCOUNTER — Ambulatory Visit: Payer: Medicare HMO | Admitting: Family Medicine

## 2016-09-12 ENCOUNTER — Encounter: Payer: Self-pay | Admitting: Family Medicine

## 2016-09-12 VITALS — BP 124/64 | HR 118 | Wt 198.0 lb

## 2016-09-12 DIAGNOSIS — E119 Type 2 diabetes mellitus without complications: Secondary | ICD-10-CM

## 2016-09-12 DIAGNOSIS — Z09 Encounter for follow-up examination after completed treatment for conditions other than malignant neoplasm: Secondary | ICD-10-CM

## 2016-09-12 NOTE — Patient Instructions (Signed)
Pelvic Mass A pelvic mass is an abnormal growth in the pelvis. The pelvis is the area between your hip bones. It includes the bladder and the rectum in males and females, and also the uterus and ovaries in females. What are the causes? Many things can cause a pelvic mass, including:  Cancer.  Fibroids of the uterus.  Ovarian cysts.  Infection.  Ectopic pregnancy. What are the signs or symptoms? Symptoms of a pelvic mass may include:  Cramping.  Nausea.  Diarrhea.  Fever.  Vomiting.  Weakness.  Pain in the pelvis, side, or back.  Weight loss.  Constipation.  Problems with vaginal bleeding, including:  Light or heavy bleeding with or without blood clots.  Irregular menstruation.  Pain with menstruation.  Problems with urination, including:  Frequent urination.  Inability to empty the bladder completely.  Urinating very small amounts.  Pain with urination.  Bloody urine. Some pelvic masses do not cause symptoms. How is this diagnosed? To make a diagnosis, your health care provider will need to learn more about the mass. You may have tests or procedures done, such as:  Blood tests.  X-rays.  Ultrasound.  CT scan.  MRI.  A surgery to look inside of your abdomen with cameras (laparoscopy).  A biopsy that is performed with a needle or during laparoscopy or surgery. In some cases, what seemed like a pelvic mass may actually be something else, such as a mass in one of the organs that are near the pelvis, an infection (abscess) or scar tissue (adhesions) that formed after a surgery. How is this treated? Treatment will depend on the cause of the mass. Follow these instructions at home: What you need to do at home will depend on the cause of the mass. Follow the instructions that your health care provider gives to you. In general:  Keep all follow-up visits as directed by your health care provider. This is important.  Take medicines only as directed  by your health care provider.  Follow any restrictions that are given to you by your health care provider. Contact a health care provider if:  You develop new symptoms. Get help right away if:  You vomit bright red blood or vomit material that looks like coffee grounds.  You have blood in your stools, or the stools turn black and tarry.  You have an abnormal or increased amount of vaginal bleeding.  You have a fever.  You develop easy bruising or bleeding.  You develop sudden or worsening pain that is not controlled by your medicine.  You feel worsening weakness, or you have a fainting episode.  You feel that the mass has suddenly gotten larger.  You develop severe bloating in your abdomen or your pelvis.  You cannot pass any urine.  You are unable to have a bowel movement. This information is not intended to replace advice given to you by your health care provider. Make sure you discuss any questions you have with your health care provider. Document Released: 09/27/2006 Document Revised: 11/26/2015 Document Reviewed: 02/03/2014 Elsevier Interactive Patient Education  2017 [employer]  

## 2016-09-12 NOTE — Progress Notes (Signed)
   Subjective:    Patient ID: Sarah Webster is a 61 y.o. female presenting with No chief complaint on file.  on 09/12/2016  HPI: Here for post op check. Had laparoscopic removal of Brenner tumor. Some postop superficial umbilical infection, treated with dicloxacillin. Seen in MAU and was healing well. Her BS are not well controlled. Reports continued drainage from umbilical incision.  Review of Systems  Constitutional: Negative for chills and fever.  Respiratory: Negative for shortness of breath.   Cardiovascular: Negative for chest pain.  Gastrointestinal: Negative for abdominal pain, nausea and vomiting.  Genitourinary: Negative for dysuria.  Skin: Negative for rash.      Objective:    BP 124/64 (BP Location: Left Arm, Patient Position: Sitting)   Pulse (!) 118   Wt 198 lb (89.8 kg)   BMI 37.41 kg/m  Physical Exam  Constitutional: She is oriented to person, place, and time. She appears well-developed and well-nourished. No distress.  HENT:  Head: Normocephalic and atraumatic.  Eyes: No scleral icterus.  Neck: Neck supple.  Cardiovascular: Normal rate.   Pulmonary/Chest: Effort normal.  Abdominal: Soft.  Umbilical skin is raw but not infected  Neurological: She is alert and oriented to person, place, and time.  Skin: Skin is warm and dry.  Psychiatric: She has a normal mood and affect.   Umbilical skin treated with AgNO3.     Assessment & Plan:   Problem List Items Addressed This Visit      Unprioritized   Diabetes mellitus ([place])    Needs improved control to promote healing--return to PCP-avoid candies and other sugar free things, which contain sugar alcohols.       Other Visit Diagnoses    Postop check    -  Primary   Doing well, pathology reviewed. Treat umbilicus with X3G1 bid to promote healing.      Total face-to-face time with patient: 10 minutes. Over 50% of encounter was spent on counseling and coordination of care. Return if symptoms worsen or fail  to improve.  Donnamae Jude 09/12/2016 9:20 AM

## 2016-09-12 NOTE — Assessment & Plan Note (Addendum)
Needs improved control to promote healing--return to PCP-avoid candies and other sugar free things, which contain sugar alcohols.

## 2016-09-30 ENCOUNTER — Other Ambulatory Visit: Payer: Self-pay | Admitting: Family

## 2016-09-30 DIAGNOSIS — Z1231 Encounter for screening mammogram for malignant neoplasm of breast: Secondary | ICD-10-CM

## 2016-10-19 ENCOUNTER — Encounter: Payer: Self-pay | Admitting: Family Medicine

## 2016-12-09 NOTE — Addendum Note (Signed)
Addendum  created 12/09/16 0950 by Lyn Hollingshead, MD   Sign clinical note

## 2016-12-13 ENCOUNTER — Encounter: Payer: Medicare HMO | Attending: Family | Admitting: *Deleted

## 2016-12-13 DIAGNOSIS — Z713 Dietary counseling and surveillance: Secondary | ICD-10-CM | POA: Diagnosis not present

## 2016-12-13 DIAGNOSIS — E1169 Type 2 diabetes mellitus with other specified complication: Secondary | ICD-10-CM | POA: Diagnosis present

## 2016-12-13 DIAGNOSIS — E119 Type 2 diabetes mellitus without complications: Secondary | ICD-10-CM

## 2016-12-13 NOTE — Patient Instructions (Signed)
Plan:  Aim for 2 Carb Choices per meal (30 grams) +/- 1 either way  Aim for 0-1 Carbs per snack if hungry  Include protein in moderation with your meals and snacks Consider reading food labels for Total Carbohydrate of foods Continue with your activity level daily as tolerated Continue checking BG at alternate times per day as directed by MD  Continue taking medication as directed by MD

## 2016-12-20 NOTE — Progress Notes (Signed)
Diabetes Self-Management Education  Visit Type: (P) First/Initial  Appt. Start Time: 1530 Appt. End Time: 1630  12/20/2016  [name], identified by name and date of birth, is a 61 y.o. female with a diagnosis of Diabetes: (P) Type 2. Patient has had Diabetes for many years with no previous Diabetes Education. She is in a lot of pain, especially throughout the night. She is able to mow her own yard and clean her home as part of her activity. She watches her Sodium intake for her diet.  ASSESSMENT  Height 5\' 1"  (1.549 m), weight 198 lb 3.2 oz (89.9 kg). Body mass index is 37.45 kg/m.      Diabetes Self-Management Education - 12/13/16 1547      Visit Information   Visit Type (P)  First/Initial     Initial Visit   Diabetes Type (P)  Type 2   Are you taking your medications as prescribed? (P)  Yes   Date Diagnosed (P)  [place]   How would you rate your overall health? (P)  Fair     Psychosocial Assessment   Patient Belief/Attitude about Diabetes (P)  --  none     Complications   Last HgB A1C per patient/outside source (P)  11.4 %   How often do you check your blood sugar? (P)  1-2 times/day     Dietary Intake   Breakfast (P)  boliled egg, lean sausage, 1 wheat bread,    Lunch (P)  tuna with wheat crackers   Beverage(s) (P)  coffee with skim milk, water      Individualized Plan for Diabetes Self-Management Training:   Learning Objective:  Patient will have a greater understanding of diabetes self-management. Patient education plan is to attend individual and/or group sessions per assessed needs and concerns.   Plan:   Patient Instructions  Plan:  Aim for 2 Carb Choices per meal (30 grams) +/- 1 either way  Aim for 0-1 Carbs per snack if hungry  Include protein in moderation with your meals and snacks Consider reading food labels for Total Carbohydrate of foods Continue with your activity level daily as tolerated Continue checking BG at  alternate times per day as directed by MD  Continue taking medication as directed by MD  Expected Outcomes:  Demonstrated interest in learning. Expect positive outcomes  Education material provided: Living Well with Diabetes, Meal plan card and Carbohydrate counting sheet  If problems or questions, patient to contact team via:  Phone  Future DSME appointment: 3-4 months

## 2016-12-23 ENCOUNTER — Ambulatory Visit
Admission: RE | Admit: 2016-12-23 | Discharge: 2016-12-23 | Disposition: A | Discharge status: Home / Self Care | Payer: Medicare HMO | Source: Ambulatory Visit | Attending: Family | Admitting: Family

## 2016-12-23 DIAGNOSIS — Z1231 Encounter for screening mammogram for malignant neoplasm of breast: Secondary | ICD-10-CM

## 2017-02-08 ENCOUNTER — Encounter: Payer: Self-pay | Admitting: Family Medicine

## 2017-03-14 ENCOUNTER — Ambulatory Visit: Payer: Medicare HMO | Admitting: *Deleted

## 2017-03-15 ENCOUNTER — Emergency Department (HOSPITAL_COMMUNITY)
Admission: EM | Admit: 2017-03-15 | Discharge: 2017-03-16 | Disposition: A | Discharge status: Home / Self Care | Payer: Medicare HMO | Attending: Emergency Medicine | Admitting: Emergency Medicine

## 2017-03-15 ENCOUNTER — Encounter (HOSPITAL_COMMUNITY): Payer: Self-pay | Admitting: Emergency Medicine

## 2017-03-15 DIAGNOSIS — R112 Nausea with vomiting, unspecified: Secondary | ICD-10-CM | POA: Diagnosis not present

## 2017-03-15 DIAGNOSIS — Z7984 Long term (current) use of oral hypoglycemic drugs: Secondary | ICD-10-CM | POA: Insufficient documentation

## 2017-03-15 DIAGNOSIS — F17220 Nicotine dependence, chewing tobacco, uncomplicated: Secondary | ICD-10-CM | POA: Diagnosis not present

## 2017-03-15 DIAGNOSIS — R1110 Vomiting, unspecified: Secondary | ICD-10-CM | POA: Diagnosis present

## 2017-03-15 DIAGNOSIS — Z79899 Other long term (current) drug therapy: Secondary | ICD-10-CM | POA: Diagnosis not present

## 2017-03-15 DIAGNOSIS — E119 Type 2 diabetes mellitus without complications: Secondary | ICD-10-CM | POA: Diagnosis not present

## 2017-03-15 DIAGNOSIS — I10 Essential (primary) hypertension: Secondary | ICD-10-CM | POA: Insufficient documentation

## 2017-03-15 DIAGNOSIS — Z7982 Long term (current) use of aspirin: Secondary | ICD-10-CM | POA: Diagnosis not present

## 2017-03-15 DIAGNOSIS — K219 Gastro-esophageal reflux disease without esophagitis: Secondary | ICD-10-CM | POA: Diagnosis not present

## 2017-03-15 DIAGNOSIS — R197 Diarrhea, unspecified: Secondary | ICD-10-CM | POA: Diagnosis not present

## 2017-03-15 LAB — COMPREHENSIVE METABOLIC PANEL
ALK PHOS: 70 U/L (ref 38–126)
ALT: 14 U/L (ref 14–54)
AST: 16 U/L (ref 15–41)
Albumin: 3.9 g/dL (ref 3.5–5.0)
Anion gap: 13 (ref 5–15)
BUN: 18 mg/dL (ref 6–20)
CALCIUM: 9.8 mg/dL (ref 8.9–10.3)
CO2: 20 mmol/L — AB (ref 22–32)
Chloride: 93 mmol/L — ABNORMAL LOW (ref 101–111)
Creatinine, Ser: 0.88 mg/dL (ref 0.44–1.00)
GFR calc non Af Amer: >60 mL/min (ref >60)
GLUCOSE: 237 mg/dL — AB (ref 65–99)
Potassium: 3.3 mmol/L — ABNORMAL LOW (ref 3.5–5.1)
SODIUM: 126 mmol/L — AB (ref 135–145)
Total Bilirubin: 0.5 mg/dL (ref 0.3–1.2)
Total Protein: 7.6 g/dL (ref 6.5–8.1)

## 2017-03-15 LAB — CBC
HEMATOCRIT: 37.1 % (ref 36.0–46.0)
HEMOGLOBIN: 12.7 g/dL (ref 12.0–15.0)
MCH: 27 pg (ref 26.0–34.0)
MCHC: 34.2 g/dL (ref 30.0–36.0)
MCV: 78.8 fL (ref 78.0–100.0)
Platelets: 384 10*3/uL (ref 150–400)
RBC: 4.71 MIL/uL (ref 3.87–5.11)
RDW: 13.5 % (ref 11.5–15.5)
WBC: 8.1 10*3/uL (ref 4.0–10.5)

## 2017-03-15 LAB — URINALYSIS, ROUTINE W REFLEX MICROSCOPIC
BILIRUBIN URINE: NEGATIVE
Bacteria, UA: NONE SEEN
Glucose, UA: >=500 mg/dL — AB
HGB URINE DIPSTICK: NEGATIVE
Ketones, ur: NEGATIVE mg/dL
Leukocytes, UA: NEGATIVE
Nitrite: NEGATIVE
PROTEIN: NEGATIVE mg/dL
SPECIFIC GRAVITY, URINE: 1.009 (ref 1.005–1.030)
Squamous Epithelial / LPF: NONE SEEN
pH: 5 (ref 5.0–8.0)

## 2017-03-15 LAB — LIPASE, BLOOD: LIPASE: 33 U/L (ref 11–51)

## 2017-03-15 MED ORDER — SODIUM CHLORIDE 0.9 % IV BOLUS (SEPSIS)
1000.0000 mL | Freq: Once | INTRAVENOUS | Status: AC
  Administered 2017-03-16: 1000 mL via INTRAVENOUS

## 2017-03-15 MED ORDER — ONDANSETRON 4 MG PO TBDP
4.0000 mg | ORAL_TABLET | Freq: Once | ORAL | Status: AC | PRN
Start: 2017-03-15 — End: 2017-03-15
  Administered 2017-03-15: 4 mg via ORAL

## 2017-03-15 MED ORDER — ALUM & MAG HYDROXIDE-SIMETH 200-200-20 MG/5ML PO SUSP
30.0000 mL | Freq: Once | ORAL | Status: AC
  Administered 2017-03-16: 30 mL via ORAL
  Filled 2017-03-15: qty 30

## 2017-03-15 MED ORDER — ONDANSETRON 4 MG PO TBDP
ORAL_TABLET | ORAL | Status: AC
  Filled 2017-03-15: qty 1

## 2017-03-15 MED ORDER — LOPERAMIDE HCL 2 MG PO CAPS
4.0000 mg | ORAL_CAPSULE | Freq: Once | ORAL | Status: AC
  Administered 2017-03-16: 4 mg via ORAL
  Filled 2017-03-15: qty 2

## 2017-03-15 MED ORDER — DIPHENHYDRAMINE HCL 25 MG PO CAPS
25.0000 mg | ORAL_CAPSULE | Freq: Once | ORAL | Status: AC
  Administered 2017-03-15: 25 mg via ORAL

## 2017-03-15 MED ORDER — DIPHENHYDRAMINE HCL 25 MG PO CAPS
ORAL_CAPSULE | ORAL | Status: AC
  Filled 2017-03-15: qty 1

## 2017-03-15 MED ORDER — PANTOPRAZOLE SODIUM 40 MG PO TBEC
40.0000 mg | DELAYED_RELEASE_TABLET | Freq: Once | ORAL | Status: AC
  Administered 2017-03-16: 40 mg via ORAL
  Filled 2017-03-15: qty 1

## 2017-03-15 MED ORDER — PROMETHAZINE HCL 25 MG/ML IJ SOLN
12.5000 mg | Freq: Once | INTRAMUSCULAR | Status: AC
  Administered 2017-03-16: 12.5 mg via INTRAVENOUS
  Filled 2017-03-15: qty 1

## 2017-03-15 NOTE — ED Provider Notes (Signed)
TIME SEEN: 11:44 PM  CHIEF COMPLAINT: Nausea, vomiting and diarrhea  HPI: Patient is a 61 year old female with history of hypertension, diabetes, GERD who presents emergency department with nausea, vomiting and diarrhea that started tonight. No fevers or chills. No abdominal pain. Is having burning in her chest that feels like heartburn. No sick contacts or recent travel, antibiotic use or hospitalization. She's had previous ovarian mass removed. No other abdominal surgery. No vaginal bleeding or discharge. No bloody stools or melena.  ROS: See HPI Constitutional: no fever  Eyes: no drainage  ENT: no runny nose   Cardiovascular:  no chest pain  Resp: no SOB  GI: diarrhea and vomiting GU: no dysuria Integumentary: no rash  Allergy: no hives  Musculoskeletal: no leg swelling  Neurological: no slurred speech ROS otherwise negative  PAST MEDICAL HISTORY/PAST SURGICAL HISTORY:  Past Medical History:  Diagnosis Date  . Anxiety   . Arthritis   . Diabetes mellitus   . GERD (gastroesophageal reflux disease)   . Hypertension   . PONV (postoperative nausea and vomiting)     MEDICATIONS:  Prior to Admission medications   Medication Sig Start Date End Date Taking? Authorizing Provider  ALPRAZolam (XANAX) 0.25 MG tablet Take 0.25 mg by mouth 2 (two) times daily as needed for anxiety.     [provider]  aspirin 81 MG tablet Take 81 mg by mouth daily.    [provider]  Cholecalciferol (VITAMIN D3) 5000 units CAPS Take 1 capsule by mouth daily.    [provider]  Cyanocobalamin (VITAMIN B-12) 3000 MCG SUBL Place 1 tablet under the tongue daily.    [provider]  Dapagliflozin Propanediol (FARXIGA) 5 MG TABS Take 10 mg by mouth daily.     [provider]  dicloxacillin (DYNAPEN) 500 MG capsule Take 1 capsule (500 mg total) by mouth 2 (two) times daily. 08/24/16   Lavonia Drafts, MD  ezetimibe-simvastatin (VYTORIN) 10-20 MG per tablet  Take 1 tablet by mouth every morning.    [provider]  gabapentin (NEURONTIN) 300 MG capsule Take 300-600 mg by mouth 2 (two) times daily.    [provider]  HYDROcodone-acetaminophen (NORCO/VICODIN) 5-325 MG per tablet Take 1 tablet by mouth 2 (two) times daily as needed for moderate pain.  12/19/14   [provider]  metFORMIN (GLUCOPHAGE) 1000 MG tablet Take 1,000 mg by mouth 2 (two) times daily with a meal.    [provider]  nystatin cream (MYCOSTATIN) Apply to affected area 2 times daily Patient taking differently: Apply 1 application topically daily as needed for dry skin.  01/15/15   Muthersbaugh, Jarrett Soho, PA-C  omeprazole (PRILOSEC) 20 MG capsule Take 20 mg by mouth 2 (two) times daily. 12/06/14   [provider]  oxyCODONE-acetaminophen (PERCOCET/ROXICET) 5-325 MG tablet Take 1-2 tablets by mouth every 6 (six) hours as needed. 08/09/16   Donnamae Jude, MD  valsartan-hydrochlorothiazide (DIOVAN-HCT) 160-12.5 MG per tablet Take 1 tablet by mouth daily. 12/05/14   [provider]    ALLERGIES:  Allergies  Allergen Reactions  . Contrast Media [Iodinated Diagnostic Agents] Other (See Comments)    "hallucinations"  . Levaquin [Levofloxacin] Anaphylaxis, Hives, Shortness Of Breath and Itching  . Other Anaphylaxis and Shortness Of Breath    Tar - Paving new roads   . Ponstel [Mefenamic Acid] Other (See Comments)    "abnormal behavior"  . Sulfa Antibiotics Anaphylaxis and Swelling    Throat and face   . Yellow Dyes (  Non-Tartrazine) Anaphylaxis  . Ciprofloxacin Hives  . Zofran [Ondansetron Hcl] Rash    SOCIAL HISTORY:  Social History  Substance Use Topics  . Smoking status: Never Smoker  . Smokeless tobacco: Current User    Types: Chew  . Alcohol use No    FAMILY HISTORY: Family History  Problem Relation Age of Onset  . Cancer Sister        lung  . Diabetes Sister   . Hypertension Sister     EXAM: BP (!) 176/88 (BP  Location: Right Arm)   Pulse 88   Temp 98.5 F (36.9 C) (Oral)   Resp 18   Ht 5\' 1"  (1.549 m)   Wt 88.5 kg (195 lb)   SpO2 95%   BMI 36.84 kg/m  CONSTITUTIONAL: Alert and oriented and responds appropriately to questions. Appears uncomfortable but is afebrile and nontoxic, actively vomiting HEAD: Normocephalic EYES: Conjunctivae clear, pupils appear equal, EOMI ENT: normal nose; moist mucous membranes NECK: Supple, no meningismus, no nuchal rigidity, no LAD  CARD: RRR; S1 and S2 appreciated; no murmurs, no clicks, no rubs, no gallops RESP: Normal chest excursion without splinting or tachypnea; breath sounds clear and equal bilaterally; no wheezes, no rhonchi, no rales, no hypoxia or respiratory distress, speaking full sentences ABD/GI: Normal bowel sounds; non-distended; soft, non-tender, no rebound, no guarding, no peritoneal signs, no hepatosplenomegaly BACK:  The back appears normal and is non-tender to palpation, there is no CVA tenderness EXT: Normal ROM in all joints; non-tender to palpation; no edema; normal capillary refill; no cyanosis, no calf tenderness or swelling    SKIN: Normal color for age and race; warm; no rash NEURO: Moves all extremities equally PSYCH: The patient's mood and manner are appropriate. Grooming and personal hygiene are appropriate.  MEDICAL DECISION MAKING: Patient here with nausea, vomiting or diarrhea. Labs show a mild hyponatremia. I will give her IV hydration and recheck this. Otherwise labs are unremarkable. She has received Zofran and started itching. No rash, lip or tongue swelling, difficulty breathing. We'll give dose of Phenergan, Imodium and reassess.  We'll also give patient Maalox and Protonix for her heartburn. We'll obtain EKG although suspicion for ACS. Abdominal exam is completely benign.  ED PROGRESS: 3:05 AM  Patient reports feeling much better after Phenergan and Imodium. She has been given IV fluids. We will recheck her sodium. She is  drinking without difficulty.    3:20 AM Pt's repeat sodium is 135. Suspect viral gastroenteritis. Abdominal exam remains benign. We'll discharge with prescriptions for Phenergan and Imodium. Recommended bland diet. Discussed return precautions. Patient comfortable with this plan.   At this time, I do not feel there is any life-threatening condition present. I have reviewed and discussed all results (EKG, imaging, lab, urine as appropriate) and exam findings with patient/family. I have reviewed nursing notes and appropriate previous records.  I feel the patient is safe to be discharged home without further emergent workup and can continue workup as an outpatient as needed. Discussed usual and customary return precautions. Patient/family verbalize understanding and are comfortable with this plan.  Outpatient follow-up has been provided if needed. All questions have been answered.     EKG Interpretation  Date/Time:  Thursday March 16 2017 01:41:18 EDT Ventricular Rate:  69 PR Interval:    QRS Duration: 95 QT Interval:  433 QTC Calculation: 464 R Axis:   57 Text Interpretation:  Sinus rhythm Atrial premature complex Confirmed by Pryor Curia [phone]) on 03/16/2017 1:43:52 AM  Elice Crigger, Delice Bison, DO 03/16/17 [phone]

## 2017-03-15 NOTE — ED Notes (Signed)
Benadryl given for skin itching after taking Zofran ODT , airway intact /respirations unlabored , no oral swelling .

## 2017-03-15 NOTE — ED Notes (Signed)
Zofran has been added to pt allergy list

## 2017-03-15 NOTE — ED Notes (Signed)
Pt reports N/V/D since this AM after eating fish and grits. Denies abd pain

## 2017-03-16 DIAGNOSIS — K219 Gastro-esophageal reflux disease without esophagitis: Secondary | ICD-10-CM | POA: Diagnosis not present

## 2017-03-16 LAB — I-STAT CHEM 8, ED
BUN: 16 mg/dL (ref 6–20)
CALCIUM ION: 1.18 mmol/L (ref 1.15–1.40)
CREATININE: 0.6 mg/dL (ref 0.44–1.00)
Chloride: 96 mmol/L — ABNORMAL LOW (ref 101–111)
Glucose, Bld: 238 mg/dL — ABNORMAL HIGH (ref 65–99)
HCT: 35 % — ABNORMAL LOW (ref 36.0–46.0)
Hemoglobin: 11.9 g/dL — ABNORMAL LOW (ref 12.0–15.0)
Potassium: 3.8 mmol/L (ref 3.5–5.1)
Sodium: 135 mmol/L (ref 135–145)
TCO2: 24 mmol/L (ref 22–32)

## 2017-03-16 MED ORDER — MORPHINE SULFATE (PF) 4 MG/ML IV SOLN
INTRAVENOUS | Status: AC
  Filled 2017-03-16: qty 1

## 2017-03-16 MED ORDER — LOPERAMIDE HCL 2 MG PO CAPS: 2.0000 mg | ORAL_CAPSULE | Freq: Four times a day (QID) | ORAL | 0 refills | Status: DC | PRN

## 2017-03-16 MED ORDER — SODIUM CHLORIDE 0.9 % IV BOLUS (SEPSIS)
1000.0000 mL | Freq: Once | INTRAVENOUS | Status: AC
  Administered 2017-03-16: 1000 mL via INTRAVENOUS

## 2017-03-16 MED ORDER — PROMETHAZINE HCL 25 MG PO TABS: 25.0000 mg | ORAL_TABLET | Freq: Four times a day (QID) | ORAL | 0 refills | Status: DC | PRN

## 2017-03-23 ENCOUNTER — Emergency Department (HOSPITAL_COMMUNITY)
Admission: EM | Admit: 2017-03-23 | Discharge: 2017-03-24 | Disposition: A | Discharge status: Home / Self Care | Payer: Medicare HMO | Attending: Emergency Medicine | Admitting: Emergency Medicine

## 2017-03-23 ENCOUNTER — Encounter (HOSPITAL_COMMUNITY): Payer: Self-pay | Admitting: Emergency Medicine

## 2017-03-23 DIAGNOSIS — Z7984 Long term (current) use of oral hypoglycemic drugs: Secondary | ICD-10-CM | POA: Insufficient documentation

## 2017-03-23 DIAGNOSIS — R1013 Epigastric pain: Secondary | ICD-10-CM

## 2017-03-23 DIAGNOSIS — K219 Gastro-esophageal reflux disease without esophagitis: Secondary | ICD-10-CM | POA: Diagnosis present

## 2017-03-23 DIAGNOSIS — K30 Functional dyspepsia: Secondary | ICD-10-CM | POA: Insufficient documentation

## 2017-03-23 DIAGNOSIS — Z79899 Other long term (current) drug therapy: Secondary | ICD-10-CM | POA: Insufficient documentation

## 2017-03-23 DIAGNOSIS — Z7982 Long term (current) use of aspirin: Secondary | ICD-10-CM | POA: Insufficient documentation

## 2017-03-23 DIAGNOSIS — R110 Nausea: Secondary | ICD-10-CM

## 2017-03-23 DIAGNOSIS — E119 Type 2 diabetes mellitus without complications: Secondary | ICD-10-CM | POA: Insufficient documentation

## 2017-03-23 DIAGNOSIS — F419 Anxiety disorder, unspecified: Secondary | ICD-10-CM | POA: Diagnosis not present

## 2017-03-23 LAB — CBC
HEMATOCRIT: 36.5 % (ref 36.0–46.0)
Hemoglobin: 12.3 g/dL (ref 12.0–15.0)
MCH: 27.2 pg (ref 26.0–34.0)
MCHC: 33.7 g/dL (ref 30.0–36.0)
MCV: 80.6 fL (ref 78.0–100.0)
PLATELETS: 408 10*3/uL — AB (ref 150–400)
RBC: 4.53 MIL/uL (ref 3.87–5.11)
RDW: 13.5 % (ref 11.5–15.5)
WBC: 8.3 10*3/uL (ref 4.0–10.5)

## 2017-03-23 LAB — COMPREHENSIVE METABOLIC PANEL
ALBUMIN: 3.7 g/dL (ref 3.5–5.0)
ALT: 16 U/L (ref 14–54)
AST: 14 U/L — AB (ref 15–41)
Alkaline Phosphatase: 70 U/L (ref 38–126)
Anion gap: 12 (ref 5–15)
BUN: 15 mg/dL (ref 6–20)
CHLORIDE: 98 mmol/L — AB (ref 101–111)
CO2: 22 mmol/L (ref 22–32)
Calcium: 9.5 mg/dL (ref 8.9–10.3)
Creatinine, Ser: 0.81 mg/dL (ref 0.44–1.00)
GFR calc Af Amer: >60 mL/min (ref >60)
GLUCOSE: 220 mg/dL — AB (ref 65–99)
Potassium: 3.6 mmol/L (ref 3.5–5.1)
SODIUM: 132 mmol/L — AB (ref 135–145)
Total Bilirubin: 0.4 mg/dL (ref 0.3–1.2)
Total Protein: 7.1 g/dL (ref 6.5–8.1)

## 2017-03-23 LAB — URINALYSIS, ROUTINE W REFLEX MICROSCOPIC
BILIRUBIN URINE: NEGATIVE
Glucose, UA: >=500 mg/dL — AB
Hgb urine dipstick: NEGATIVE
KETONES UR: NEGATIVE mg/dL
LEUKOCYTES UA: NEGATIVE
Nitrite: NEGATIVE
PH: 5 (ref 5.0–8.0)
Protein, ur: NEGATIVE mg/dL
SPECIFIC GRAVITY, URINE: 1.024 (ref 1.005–1.030)

## 2017-03-23 LAB — I-STAT TROPONIN, ED: TROPONIN I, POC: 0 ng/mL (ref 0.00–0.08)

## 2017-03-23 LAB — LIPASE, BLOOD: LIPASE: 27 U/L (ref 11–51)

## 2017-03-23 LAB — CBG MONITORING, ED: GLUCOSE-CAPILLARY: 286 mg/dL — AB (ref 65–99)

## 2017-03-23 MED ORDER — RANITIDINE HCL 150 MG/10ML PO SYRP
300.0000 mg | ORAL_SOLUTION | Freq: Once | ORAL | Status: AC
  Administered 2017-03-23: 300 mg via ORAL
  Filled 2017-03-23: qty 20

## 2017-03-23 MED ORDER — RANITIDINE HCL 150 MG PO TABS: 150.0000 mg | ORAL_TABLET | Freq: Two times a day (BID) | ORAL | 0 refills | Status: DC

## 2017-03-23 MED ORDER — SODIUM CHLORIDE 0.9 % IV BOLUS (SEPSIS): 1000.0000 mL | Freq: Once | INTRAVENOUS | Status: DC

## 2017-03-23 MED ORDER — OMEPRAZOLE 20 MG PO CPDR: 40.0000 mg | DELAYED_RELEASE_CAPSULE | Freq: Two times a day (BID) | ORAL | 0 refills | Status: DC

## 2017-03-23 NOTE — ED Triage Notes (Signed)
Pt reports persistent nausea for 2 weeks, denies abd pain, vomiting or diarrhea.  Pt burping frequently, reports recent change to diet to address GERD symptoms.

## 2017-03-23 NOTE — Discharge Instructions (Signed)
You presented to ED for evaluation of belching and nausea, similar to previous acid reflux symptoms.   Your lab work, urine, EKG, heart enzymes looked ok today.   I suspect your acid reflux is not well controlled and your medications may need to be changed.   Your omeprazole dose has been increased. Additionally, take zantac. Read attached information on acid reflux diet. Call gastroenterology office and make an appointment as soon as possible for further evaluation of acid reflux symptoms.

## 2017-03-23 NOTE — ED Provider Notes (Signed)
[facility] DEPT Provider Note   CSN: [id number] Arrival date & time: 03/23/17  1328     History   Chief Complaint Chief Complaint  Patient presents with  . Nausea    HPI Sarah Webster is a 61 y.o. female with history of obesity, non-insulin-dependent diabetes, diabetic neuropathy, hypertension, GERD presents to the ED for evaluation of "acid reflux" x 2 weeks. Associated symptoms include increased belching, diffuse lower abdominal discomfort, flatulence. Lower abdomen discomfort is alleviated with massage and passing gas and has improved significantly in the last 2 weeks. States her symptoms started two weeks ago when she was diagnosed with stomach virus. Lower abdominal discomfort, gas and diarrhea have improved however belching and nausea have persisted despite omeprazole and phenergan used at home. States these symptoms are typical of her acid reflux however states that it is usually adequately controlled with omeprazole.  States she ate curry goat 4 days ago. H/o ovarian mass removal, no other abdominal surgeries. No previous h/o MI. Has taken phenergan for nausea with minimal relief.   No chest pain, shortness of breath, lightheadedness, palpitations, vomiting, dysuria, diarrhea, constipation, diarrhea, melena, hematochezia, fevers, chills, vaginal discharge or bleeding. No sick contacts, recent travel, antibiotic use or hospitalization.  HPI  Past Medical History:  Diagnosis Date  . Anxiety   . Arthritis   . Diabetes mellitus   . GERD (gastroesophageal reflux disease)   . Hypertension   . PONV (postoperative nausea and vomiting)     Patient Active Problem List   Diagnosis Date Noted  . Diabetes mellitus ([place]) 05/02/2016  . Hypertension 05/02/2016  . Diabetic neuropathy ([place]) 05/02/2016  . Anxiety attack 05/02/2016  . GERD (gastroesophageal reflux disease) 05/02/2016  . Pelvic mass in female 05/02/2016    Past Surgical History:  Procedure Laterality Date  .  ABDOMINAL HYSTERECTOMY    . CARPAL TUNNEL RELEASE    . HAND SURGERY      OB History    Gravida Para Term Preterm AB Living   4 4       3    SAB TAB Ectopic Multiple Live Births           4       Home Medications    Prior to Admission medications   Medication Sig Start Date End Date Taking? Authorizing Provider  ALPRAZolam (XANAX) 0.25 MG tablet Take 0.25 mg by mouth 2 (two) times daily as needed for anxiety.     [provider]  aspirin 81 MG tablet Take 81 mg by mouth daily.    [provider]  Cholecalciferol (VITAMIN D3) 5000 units CAPS Take 1 capsule by mouth daily.    [provider]  Cyanocobalamin (VITAMIN B-12) 3000 MCG SUBL Place 1 tablet under the tongue daily.    [provider]  Dapagliflozin Propanediol (FARXIGA) 5 MG TABS Take 10 mg by mouth daily.     [provider]  dicloxacillin (DYNAPEN) 500 MG capsule Take 1 capsule (500 mg total) by mouth 2 (two) times daily. 08/24/16   Lavonia Drafts, MD  ezetimibe-simvastatin (VYTORIN) 10-20 MG per tablet Take 1 tablet by mouth every morning.    [provider]  gabapentin (NEURONTIN) 300 MG capsule Take 300-600 mg by mouth 2 (two) times daily.    [provider]  HYDROcodone-acetaminophen (NORCO/VICODIN) 5-325 MG per tablet Take 1 tablet by mouth 2 (two) times daily as needed for moderate pain.  12/19/14   [provider]  loperamide (IMODIUM) 2 MG capsule  Take 1 capsule (2 mg total) by mouth 4 (four) times daily as needed for diarrhea or loose stools. 03/16/17   Ward, Delice Bison, DO  metFORMIN (GLUCOPHAGE) 1000 MG tablet Take 1,000 mg by mouth 2 (two) times daily with a meal.    [provider]  nystatin cream (MYCOSTATIN) Apply to affected area 2 times daily Patient taking differently: Apply 1 application topically daily as needed for dry skin.  01/15/15   Muthersbaugh, Jarrett Soho, PA-C  omeprazole (PRILOSEC) 20 MG capsule Take 2 capsules (40 mg  total) by mouth 2 (two) times daily before a meal. 03/23/17 04/06/17  Kinnie Feil, PA-C  oxyCODONE-acetaminophen (PERCOCET/ROXICET) 5-325 MG tablet Take 1-2 tablets by mouth every 6 (six) hours as needed. 08/09/16   Donnamae Jude, MD  promethazine (PHENERGAN) 25 MG tablet Take 1 tablet (25 mg total) by mouth every 6 (six) hours as needed for nausea or vomiting. 03/16/17   Ward, Delice Bison, DO  ranitidine (ZANTAC) 150 MG tablet Take 1 tablet (150 mg total) by mouth 2 (two) times daily. 03/23/17   Kinnie Feil, PA-C  valsartan-hydrochlorothiazide (DIOVAN-HCT) 160-12.5 MG per tablet Take 1 tablet by mouth daily. 12/05/14   [provider]    Family History Family History  Problem Relation Age of Onset  . Cancer Sister        lung  . Diabetes Sister   . Hypertension Sister     Social History Social History  Substance Use Topics  . Smoking status: Never Smoker  . Smokeless tobacco: Current User    Types: Chew  . Alcohol use No     Allergies   Contrast media [iodinated diagnostic agents]; Levaquin [levofloxacin]; Other; Ponstel [mefenamic acid]; Sulfa antibiotics; Yellow dyes (non-tartrazine); Ciprofloxacin; and Zofran [ondansetron hcl]   Review of Systems Review of Systems  Constitutional: Negative for chills and fever.  Respiratory: Negative for shortness of breath.   Cardiovascular: Negative for chest pain and palpitations.  Gastrointestinal: Positive for abdominal pain and nausea. Negative for blood in stool, constipation, diarrhea and vomiting.       +belching +gas  Genitourinary: Positive for frequency. Negative for dysuria, vaginal bleeding, vaginal discharge and vaginal pain.  Allergic/Immunologic: Positive for immunocompromised state (DM).     Physical Exam Updated Vital Signs BP 124/64   Pulse (!) 55   Temp 98.2 F (36.8 C) (Oral)   Resp 17   Ht 5\' 1"  (1.549 m)   Wt 87.1 kg (192 lb)   SpO2 100%   BMI 36.28 kg/m   Physical Exam    Constitutional: She is oriented to person, place, and time. She appears well-developed and well-nourished. No distress.  HENT:  Head: Normocephalic and atraumatic.  Nose: Nose normal.  Mouth/Throat: Oropharynx is clear and moist. No oropharyngeal exudate.  Eyes: Pupils are equal, round, and reactive to light. Conjunctivae and EOM are normal.  Neck: Normal range of motion. Neck supple.  Cardiovascular: Normal rate, regular rhythm, normal heart sounds and intact distal pulses.   No murmur heard. Pulmonary/Chest: Effort normal and breath sounds normal. No respiratory distress. She has no wheezes. She has no rales.  No chest wall tenderness   Abdominal: Soft. Bowel sounds are normal. She exhibits no distension and no mass. There is tenderness. There is no rebound and no guarding.  Diffuse lower abdominal tenderness No epigastric tenderness  No CVAT  Musculoskeletal: Normal range of motion. She exhibits no deformity.  Lymphadenopathy:    She has no cervical adenopathy.  Neurological:  She is alert and oriented to person, place, and time. No sensory deficit.  Skin: Skin is warm and dry. Capillary refill takes less than 2 seconds.  Psychiatric: She has a normal mood and affect. Her behavior is normal. Judgment and thought content normal.  Nursing note and vitals reviewed.    ED Treatments / Results  Labs (all labs ordered are listed, but only abnormal results are displayed) Labs Reviewed  COMPREHENSIVE METABOLIC PANEL - Abnormal; Notable for the following:       Result Value   Sodium 132 (*)    Chloride 98 (*)    Glucose, Bld 220 (*)    AST 14 (*)    All other components within normal limits  CBC - Abnormal; Notable for the following:    Platelets 408 (*)    All other components within normal limits  URINALYSIS, ROUTINE W REFLEX MICROSCOPIC - Abnormal; Notable for the following:    Color, Urine STRAW (*)    Glucose, UA >=500 (*)    Bacteria, UA RARE (*)    Squamous Epithelial /  LPF 0-5 (*)    All other components within normal limits  CBG MONITORING, ED - Abnormal; Notable for the following:    Glucose-Capillary 286 (*)    All other components within normal limits  LIPASE, BLOOD  I-STAT TROPONIN, ED    EKG  EKG Interpretation  Date/Time:  Thursday March 23 2017 23:12:21 EDT Ventricular Rate:  61 PR Interval:    QRS Duration: 90 QT Interval:  423 QTC Calculation: 427 R Axis:   66 Text Interpretation:  Sinus rhythm Low voltage, precordial leads No significant change since last tracing Confirmed by Gareth Morgan [phone]) on 03/23/2017 11:54:57 PM       Radiology No results found.  Procedures Procedures (including critical care time)  Medications Ordered in ED Medications  ranitidine (ZANTAC) 150 MG/10ML syrup 300 mg (300 mg Oral Given 03/23/17 2259)     Initial Impression / Assessment and Plan / ED Course  I have reviewed the triage vital signs and the nursing notes.  Pertinent labs & imaging results that were available during my care of the patient were reviewed by me and considered in my medical decision making (see chart for details).    61 year old female with history of diabetes without insulin, hypertension, obesity, GERD presents to the ED for evaluation of "acid reflux", persistent for 2 weeks. Associated nausea, increased belching, diffuse lower abdominal discomfort and increased gas. Symptoms started after she was diagnosed with GI virus in ED. States abdominal discomfort and flatulence have significantly improved but nausea and belching have persisted despite omeprazole which typically control acid reflux.  States she had a "big" BM in the waiting room which helped her abdominal discomfort and gas, which she does not currently have. No CP, SOB or epigastric pain. No h/o MI. ACS risk factors include DM without insulin, hypertension and obesity.   On my exam she is nontoxic appearing. Vital signs are WNL. Cardiopulmonary exam is benign.  No epigastric or CVA tenderness. Abdomen is soft and non tender. She is belching during exam. Considering GERD versus diabetic gastroparesis versus atypical ACS although highest suspicion for GERD. Had curry goat a few days ago.   Triage ordered CBC, CMP, lipase, urinalysis which are mostly reassuring other than hyperglycemia and glucosuria. No evidence of DKA, gap normal. Given persistent acid reflux sx, age, h/o DM EKG and trop were obtained and were normal. Pt was given zantac PO. She is  requesting food.   Final Clinical Impressions(s) / ED Diagnoses   Given reassuring work up and classic presentation of GERD with recent intake of spicy meal high suspicion for refractory GERD. She has no alarm symptoms.  She tolerated PO and symptoms resolved in ED after zantac. Very low suspicion for atypical ACS presentation in this patient in setting or normal trop and EKG. She never had CP, Sob, light headedness, delta trop not indicated.  Will d/c with increase PPI, zantac, diet changes and GI f/u. Discussed ED return precautions.   Final diagnoses:  Nausea  Dyspepsia    New Prescriptions New Prescriptions   RANITIDINE (ZANTAC) 150 MG TABLET    Take 1 tablet (150 mg total) by mouth 2 (two) times daily.     Kinnie Feil, PA-C 03/23/17 2357    Gareth Morgan, MD 03/24/17 [phone]

## 2017-03-26 ENCOUNTER — Encounter (HOSPITAL_COMMUNITY): Payer: Self-pay | Admitting: *Deleted

## 2017-03-26 ENCOUNTER — Emergency Department (HOSPITAL_COMMUNITY): Payer: Medicare HMO

## 2017-03-26 ENCOUNTER — Emergency Department (HOSPITAL_COMMUNITY)
Admission: EM | Admit: 2017-03-26 | Discharge: 2017-03-26 | Disposition: A | Discharge status: Home / Self Care | Payer: Medicare HMO | Attending: Emergency Medicine | Admitting: Emergency Medicine

## 2017-03-26 DIAGNOSIS — I10 Essential (primary) hypertension: Secondary | ICD-10-CM | POA: Insufficient documentation

## 2017-03-26 DIAGNOSIS — T50905A Adverse effect of unspecified drugs, medicaments and biological substances, initial encounter: Secondary | ICD-10-CM

## 2017-03-26 DIAGNOSIS — E119 Type 2 diabetes mellitus without complications: Secondary | ICD-10-CM | POA: Diagnosis not present

## 2017-03-26 DIAGNOSIS — Y829 Unspecified medical devices associated with adverse incidents: Secondary | ICD-10-CM | POA: Insufficient documentation

## 2017-03-26 DIAGNOSIS — Z7984 Long term (current) use of oral hypoglycemic drugs: Secondary | ICD-10-CM | POA: Insufficient documentation

## 2017-03-26 DIAGNOSIS — Z79899 Other long term (current) drug therapy: Secondary | ICD-10-CM | POA: Diagnosis not present

## 2017-03-26 DIAGNOSIS — Z7982 Long term (current) use of aspirin: Secondary | ICD-10-CM | POA: Diagnosis not present

## 2017-03-26 DIAGNOSIS — T887XXA Unspecified adverse effect of drug or medicament, initial encounter: Secondary | ICD-10-CM | POA: Insufficient documentation

## 2017-03-26 DIAGNOSIS — R0602 Shortness of breath: Secondary | ICD-10-CM | POA: Diagnosis present

## 2017-03-26 LAB — BASIC METABOLIC PANEL
ANION GAP: 13 (ref 5–15)
BUN: 9 mg/dL (ref 6–20)
CALCIUM: 9.5 mg/dL (ref 8.9–10.3)
CO2: 19 mmol/L — ABNORMAL LOW (ref 22–32)
Chloride: 100 mmol/L — ABNORMAL LOW (ref 101–111)
Creatinine, Ser: 0.81 mg/dL (ref 0.44–1.00)
GFR calc Af Amer: >60 mL/min (ref >60)
GLUCOSE: 244 mg/dL — AB (ref 65–99)
Potassium: 4 mmol/L (ref 3.5–5.1)
Sodium: 132 mmol/L — ABNORMAL LOW (ref 135–145)

## 2017-03-26 LAB — TROPONIN I

## 2017-03-26 LAB — CBC
HCT: 38.2 % (ref 36.0–46.0)
HEMOGLOBIN: 12.6 g/dL (ref 12.0–15.0)
MCH: 27 pg (ref 26.0–34.0)
MCHC: 33 g/dL (ref 30.0–36.0)
MCV: 81.8 fL (ref 78.0–100.0)
Platelets: 332 10*3/uL (ref 150–400)
RBC: 4.67 MIL/uL (ref 3.87–5.11)
RDW: 13.6 % (ref 11.5–15.5)
WBC: 7.9 10*3/uL (ref 4.0–10.5)

## 2017-03-26 LAB — D-DIMER, QUANTITATIVE: D-Dimer, Quant: 0.62 ug/mL-FEU — ABNORMAL HIGH (ref 0.00–0.50)

## 2017-03-26 LAB — CBG MONITORING, ED: GLUCOSE-CAPILLARY: 210 mg/dL — AB (ref 65–99)

## 2017-03-26 NOTE — Discharge Instructions (Signed)
Please follow up closely with your provider next week for further management of your medication for your blood pressure.  If you develop increase shortness of breath despite not taking your new blood pressure medication, please return for further care.

## 2017-03-26 NOTE — ED Triage Notes (Signed)
Pt is here with sob and throat discomfort.  Pt thinks it is related to her blood pressure pill.

## 2017-03-26 NOTE — ED Notes (Signed)
Pt ambulated efficiently with a POX above 95% during the walk.

## 2017-03-26 NOTE — ED Notes (Signed)
RN called main lab to add on D-Dimer

## 2017-03-26 NOTE — ED Provider Notes (Signed)
[facility] DEPT Provider Note   CSN: [id number] Arrival date & time: 03/26/17  1345     History   Chief Complaint Chief Complaint  Patient presents with  . Shortness of Breath    HPI Sarah Webster is a 61 y.o. female.   Shortness of Breath      61 year old female with history of diabetes, GERD, hypertension, anxiety presenting for evaluation of shortness of breath. patient report a week ago when she follow-up with her PCP She was told that one of her blood pressure medication will need to be changed due to new cancer risk.  She was switched from Diovan to irbesartan/HCTZ.  Since taking the new blood pressure medication, patient noticed that her blood pressure has been fluctuating wildly. She decided to stop the medication for a day and today she took half the pill. States that after taking the pill she noticed discomfort in her throat, and having some trouble breathing. She is unsure if this is due to her medication and due to anxiety but decided to come to the ER for further evaluation. Patient does not complain of any lightheadedness, dizziness, chest pain, active short of breath, wheezing, throat swelling, tongue swelling, abdominal cramping or rash. Her primary concern is whether she should continue taking her blood pressure medication.  Past Medical History:  Diagnosis Date  . Anxiety   . Arthritis   . Diabetes mellitus   . GERD (gastroesophageal reflux disease)   . Hypertension   . PONV (postoperative nausea and vomiting)     Patient Active Problem List   Diagnosis Date Noted  . Diabetes mellitus ([place]) 05/02/2016  . Hypertension 05/02/2016  . Diabetic neuropathy ([place]) 05/02/2016  . Anxiety attack 05/02/2016  . GERD (gastroesophageal reflux disease) 05/02/2016  . Pelvic mass in female 05/02/2016    Past Surgical History:  Procedure Laterality Date  . ABDOMINAL HYSTERECTOMY    . CARPAL TUNNEL RELEASE    . HAND SURGERY      OB History    Gravida Para Term  Preterm AB Living   4 4       3    SAB TAB Ectopic Multiple Live Births           4       Home Medications    Prior to Admission medications   Medication Sig Start Date End Date Taking? Authorizing Provider  ALPRAZolam (XANAX) 0.25 MG tablet Take 0.25 mg by mouth 2 (two) times daily as needed for anxiety.     [provider]  aspirin 81 MG tablet Take 81 mg by mouth daily.    [provider]  Cholecalciferol (VITAMIN D3) 5000 units CAPS Take 1 capsule by mouth daily.    [provider]  Cyanocobalamin (VITAMIN B-12) 3000 MCG SUBL Place 1 tablet under the tongue daily.    [provider]  Dapagliflozin Propanediol (FARXIGA) 5 MG TABS Take 10 mg by mouth daily.     [provider]  dicloxacillin (DYNAPEN) 500 MG capsule Take 1 capsule (500 mg total) by mouth 2 (two) times daily. 08/24/16   Lavonia Drafts, MD  ezetimibe-simvastatin (VYTORIN) 10-20 MG per tablet Take 1 tablet by mouth every morning.    [provider]  gabapentin (NEURONTIN) 300 MG capsule Take 300-600 mg by mouth 2 (two) times daily.    [provider]  HYDROcodone-acetaminophen (NORCO/VICODIN) 5-325 MG per tablet Take 1 tablet by mouth 2 (two) times daily as needed for moderate pain.  12/19/14  [provider]  loperamide (IMODIUM) 2 MG capsule Take 1 capsule (2 mg total) by mouth 4 (four) times daily as needed for diarrhea or loose stools. 03/16/17   Ward, Delice Bison, DO  metFORMIN (GLUCOPHAGE) 1000 MG tablet Take 1,000 mg by mouth 2 (two) times daily with a meal.    [provider]  nystatin cream (MYCOSTATIN) Apply to affected area 2 times daily Patient taking differently: Apply 1 application topically daily as needed for dry skin.  01/15/15   Muthersbaugh, Jarrett Soho, PA-C  omeprazole (PRILOSEC) 20 MG capsule Take 2 capsules (40 mg total) by mouth 2 (two) times daily before a meal. 03/23/17 04/06/17  Kinnie Feil, PA-C    oxyCODONE-acetaminophen (PERCOCET/ROXICET) 5-325 MG tablet Take 1-2 tablets by mouth every 6 (six) hours as needed. 08/09/16   Donnamae Jude, MD  promethazine (PHENERGAN) 25 MG tablet Take 1 tablet (25 mg total) by mouth every 6 (six) hours as needed for nausea or vomiting. 03/16/17   Ward, Delice Bison, DO  ranitidine (ZANTAC) 150 MG tablet Take 1 tablet (150 mg total) by mouth 2 (two) times daily. 03/23/17   Kinnie Feil, PA-C  valsartan-hydrochlorothiazide (DIOVAN-HCT) 160-12.5 MG per tablet Take 1 tablet by mouth daily. 12/05/14   [provider]    Family History Family History  Problem Relation Age of Onset  . Cancer Sister        lung  . Diabetes Sister   . Hypertension Sister     Social History Social History  Substance Use Topics  . Smoking status: Never Smoker  . Smokeless tobacco: Current User    Types: Chew  . Alcohol use No     Allergies   Contrast media [iodinated diagnostic agents]; Levaquin [levofloxacin]; Other; Ponstel [mefenamic acid]; Sulfa antibiotics; Yellow dyes (non-tartrazine); Ciprofloxacin; and Zofran [ondansetron hcl]   Review of Systems Review of Systems  Respiratory: Positive for shortness of breath.   All other systems reviewed and are negative.    Physical Exam Updated Vital Signs BP 135/64 (BP Location: Left Arm)   Pulse 89   Temp 98.2 F (36.8 C) (Oral)   Resp 20   SpO2 100%   Physical Exam  Constitutional: She appears well-developed and well-nourished. No distress.  Patient is well-appearing, speaking in normal sentences in no acute respiratory discomfort.  HENT:  Head: Atraumatic.  Mouth: Normal tone, normal oral mucosa  Eyes: Conjunctivae are normal.  Neck: Neck supple.  Cardiovascular: Normal rate, regular rhythm and intact distal pulses.   Pulmonary/Chest: Effort normal and breath sounds normal. No respiratory distress. She has no wheezes.  Abdominal: Soft. She exhibits no distension. There is no tenderness.   Musculoskeletal: She exhibits no edema.  Neurological: She is alert.  Skin: No rash noted.  Psychiatric: She has a normal mood and affect.  Nursing note and vitals reviewed.    ED Treatments / Results  Labs (all labs ordered are listed, but only abnormal results are displayed) Labs Reviewed  BASIC METABOLIC PANEL - Abnormal; Notable for the following:       Result Value   Sodium 132 (*)    Chloride 100 (*)    CO2 19 (*)    Glucose, Bld 244 (*)    All other components within normal limits  D-DIMER, QUANTITATIVE (NOT AT [facility]) - Abnormal; Notable for the following:    D-Dimer, Quant 0.62 (*)    All other components within normal limits  CBG MONITORING, ED - Abnormal; Notable for the following:  Glucose-Capillary 210 (*)    All other components within normal limits  CBC  TROPONIN I    EKG  EKG Interpretation  Date/Time:  Sunday March 26 2017 14:02:55 EDT Ventricular Rate:  84 PR Interval:  150 QRS Duration: 84 QT Interval:  376 QTC Calculation: 444 R Axis:   65 Text Interpretation:  Normal sinus rhythm Normal ECG Nonspecific ST and T wave abnormality in the infrior and lateral leads are new No acute changes [id number] Confirmed by Varney Biles [phone]) on 03/26/2017 8:49:27 PM      Date: 03/26/2017  Rate: 84  Rhythm: normal sinus rhythm  QRS Axis: normal  Intervals: normal  ST/T Wave abnormalities: normal  Conduction Disutrbances: none  Narrative Interpretation:   Old EKG Reviewed: No significant changes noted     Radiology Dg Chest 2 View  Result Date: 03/26/2017 CLINICAL DATA:  Chest pain, short of breath EXAM: CHEST  2 VIEW COMPARISON:  None. FINDINGS: Normal mediastinum and cardiac silhouette. Normal pulmonary vasculature. No evidence of effusion, infiltrate, or pneumothorax. No acute bony abnormality. IMPRESSION: Normal chest radiograph Electronically Signed   By: Suzy Bouchard M.D.   On: 03/26/2017 14:52    Procedures Procedures (including  critical care time)  Medications Ordered in ED Medications - No data to display   Initial Impression / Assessment and Plan / ED Course  I have reviewed the triage vital signs and the nursing notes.  Pertinent labs & imaging results that were available during my care of the patient were reviewed by me and considered in my medical decision making (see chart for details).     BP 131/77   Pulse 61   Temp 98.2 F (36.8 C) (Oral)   Resp 11   SpO2 98%    Final Clinical Impressions(s) / ED Diagnoses   Final diagnoses:  Medication reaction, initial encounter    New Prescriptions New Prescriptions   No medications on file   7:44 PM Patient is here due to concern of been on her new blood pressure medication and states that is has fluctuated wildly. Her systolic ranges between 742 to 160. Currently her blood pressure stable at 135/64,and she does not appears to have any obvious signs of allergic reaction.labs obtained today including chest x-ray, EKG, troponin, and basic labs all of which are at her baseline. Mild hyperglycemia with a blood sugar of 210. No evidence of DKA.reassurance given, recommend patient to follow-up with primary care provider to extremities. She should continue with her new blood pressure medication.  8:55 PM EKG with S1Q3T3 pattern.  Pt does report that her daughter had blood clot in the past.  She also report falling off a tree several month ago and does report having intermittent R leg pain, mild.  Otherwise, no other significant risk factors for PE.  Will obtain D-dimer. Care discussed with Dr. Kathrynn Humble.  10:14 PM D-dimer is mildly elevated to 0.62.  Pt however is allergic to IV contrast media.  After discussing with pt the risk/benefit of PE study and the chance of PE, pt felt the sxs she is having is more likely due to her medication.  She is not hypoxic, not tachycardic and maintaing 100% O2 on RA.  Therefore, pt d/c with close return precaution .     Domenic Moras, PA-C 03/26/17 2233    Varney Biles, MD 03/27/17 [id number]

## 2017-08-28 ENCOUNTER — Emergency Department (HOSPITAL_COMMUNITY)
Admission: EM | Admit: 2017-08-28 | Discharge: 2017-08-28 | Disposition: A | Discharge status: Home / Self Care | Payer: Medicare HMO | Attending: Emergency Medicine | Admitting: Emergency Medicine

## 2017-08-28 ENCOUNTER — Emergency Department (HOSPITAL_COMMUNITY): Payer: Medicare HMO

## 2017-08-28 ENCOUNTER — Other Ambulatory Visit: Payer: Self-pay

## 2017-08-28 ENCOUNTER — Encounter (HOSPITAL_COMMUNITY): Payer: Self-pay | Admitting: *Deleted

## 2017-08-28 DIAGNOSIS — I10 Essential (primary) hypertension: Secondary | ICD-10-CM | POA: Insufficient documentation

## 2017-08-28 DIAGNOSIS — Z7982 Long term (current) use of aspirin: Secondary | ICD-10-CM | POA: Diagnosis not present

## 2017-08-28 DIAGNOSIS — R52 Pain, unspecified: Secondary | ICD-10-CM

## 2017-08-28 DIAGNOSIS — Z79899 Other long term (current) drug therapy: Secondary | ICD-10-CM | POA: Diagnosis not present

## 2017-08-28 DIAGNOSIS — E119 Type 2 diabetes mellitus without complications: Secondary | ICD-10-CM | POA: Insufficient documentation

## 2017-08-28 DIAGNOSIS — R0789 Other chest pain: Secondary | ICD-10-CM | POA: Insufficient documentation

## 2017-08-28 DIAGNOSIS — Z7984 Long term (current) use of oral hypoglycemic drugs: Secondary | ICD-10-CM | POA: Insufficient documentation

## 2017-08-28 LAB — CBC
HEMATOCRIT: 39.4 % (ref 36.0–46.0)
HEMOGLOBIN: 13.2 g/dL (ref 12.0–15.0)
MCH: 27.5 pg (ref 26.0–34.0)
MCHC: 33.5 g/dL (ref 30.0–36.0)
MCV: 82.1 fL (ref 78.0–100.0)
Platelets: 424 10*3/uL — ABNORMAL HIGH (ref 150–400)
RBC: 4.8 MIL/uL (ref 3.87–5.11)
RDW: 13.6 % (ref 11.5–15.5)
WBC: 8.3 10*3/uL (ref 4.0–10.5)

## 2017-08-28 LAB — CBG MONITORING, ED
Glucose-Capillary: 157 mg/dL — ABNORMAL HIGH (ref 65–99)
Glucose-Capillary: 270 mg/dL — ABNORMAL HIGH (ref 65–99)

## 2017-08-28 LAB — BASIC METABOLIC PANEL
ANION GAP: 14 (ref 5–15)
BUN: 12 mg/dL (ref 6–20)
CO2: 20 mmol/L — ABNORMAL LOW (ref 22–32)
Calcium: 9.8 mg/dL (ref 8.9–10.3)
Chloride: 101 mmol/L (ref 101–111)
Creatinine, Ser: 0.77 mg/dL (ref 0.44–1.00)
GFR calc Af Amer: >60 mL/min (ref >60)
Glucose, Bld: 197 mg/dL — ABNORMAL HIGH (ref 65–99)
POTASSIUM: 3.8 mmol/L (ref 3.5–5.1)
SODIUM: 135 mmol/L (ref 135–145)

## 2017-08-28 LAB — I-STAT TROPONIN, ED: Troponin i, poc: 0 ng/mL (ref 0.00–0.08)

## 2017-08-28 MED ORDER — MELOXICAM 15 MG PO TABS: 15.0000 mg | ORAL_TABLET | Freq: Every day | ORAL | 0 refills | Status: AC

## 2017-08-28 NOTE — Discharge Instructions (Signed)
Start taking Mobic once daily for pain.  Drink plenty of fluids and get plenty of rest.  Take your home medicines as prescribed.  Do not take ibuprofen, Advil, or Aleve while you are taking Mobic.  Take Mobic with food to avoid upset stomach issues.  Follow-up with your primary care physician for reevaluation of your chronic pain and establishment with a pain clinic.  Return to the emergency department if any concerning signs or symptoms develop.

## 2017-08-28 NOTE — ED Provider Notes (Signed)
[facility] EMERGENCY DEPARTMENT Provider Note   CSN: [id number] Arrival date & time: 08/28/17  1249     History   Chief Complaint Chief Complaint  Patient presents with  . Pain    HPI Sarah Webster is a 62 y.o. female with history of anxiety, rheumatoid arthritis, GERD, HTN, DM, and anxiety attacks presents today for evaluation of gradual onset, progressive Lee worsening exacerbation of her chronic pain for 6 weeks.  She states that for the past month and a half she has been experiencing constant aching pain  "all over ", however she says pain is worst in her left shoulder and right groin.  She also notes constant nonspecific chest pain which she describes as an aching sensation.  Denies shortness of breath.  Pain is nonexertional.  She denies fevers or chills, lightheadedness, syncope, but does state that her pain is so bad that she does not want to get out of bed sometimes.  She states that she was seeing a pain management physician but was given Percocet after undergoing a procedure to remove  an ovarian mass and was discharged from her pain management practice after that.  She has been receiving Xanax from her primary care provider, most recently filled alprazolam 0.25 mg tablets #60/0 on 08/23/17.  She states that the pain that she is experiencing is the same as the pain she has experienced for several years.  She has not made any attempts to set up follow-up with another pain management practice.  She has tried topical muscle rubs and ibuprofen with some relief of her symptoms but states "I wanted to stop taking the ibuprofen because I did not want to tear up my stomach ".  She is also requesting assessment of swelling of focal areas on the bilateral knees.  She states that she thinks that these are varicose veins.  She says that they are sometimes tender.  No recent travel or surgeries, no prior history of DVT or PE.  The history is provided by the patient.    Past  Medical History:  Diagnosis Date  . Anxiety   . Arthritis   . Diabetes mellitus   . GERD (gastroesophageal reflux disease)   . Hypertension   . PONV (postoperative nausea and vomiting)     Patient Active Problem List   Diagnosis Date Noted  . Diabetes mellitus ([place]) 05/02/2016  . Hypertension 05/02/2016  . Diabetic neuropathy ([place]) 05/02/2016  . Anxiety attack 05/02/2016  . GERD (gastroesophageal reflux disease) 05/02/2016  . Pelvic mass in female 05/02/2016    Past Surgical History:  Procedure Laterality Date  . ABDOMINAL HYSTERECTOMY    . CARPAL TUNNEL RELEASE    . HAND SURGERY      OB History    Gravida Para Term Preterm AB Living   4 4       3    SAB TAB Ectopic Multiple Live Births           4       Home Medications    Prior to Admission medications   Medication Sig Start Date End Date Taking? Authorizing Provider  ALPRAZolam (XANAX) 0.25 MG tablet Take 0.25 mg by mouth 2 (two) times daily.     [provider]  aspirin EC 81 MG tablet Take 81 mg by mouth daily.    [provider]  cholecalciferol (VITAMIN D) 1000 units tablet Take 1,000 Units by mouth daily.    [provider]  Cyanocobalamin (VITAMIN  B-12) 3000 MCG SUBL Place 3,000 mcg under the tongue daily.     [provider]  dapagliflozin propanediol (FARXIGA) 10 MG TABS tablet Take 10 mg by mouth daily.    [provider]  ezetimibe-simvastatin (VYTORIN) 10-20 MG per tablet Take 1 tablet by mouth daily.     [provider]  ibuprofen (ADVIL,MOTRIN) 800 MG tablet Take 800 mg by mouth 3 (three) times daily as needed (pain).  03/19/17   [provider]  irbesartan-hydrochlorothiazide (AVALIDE) 150-12.5 MG tablet Take 0.5 tablets by mouth daily before breakfast.  02/20/17   [provider]  meloxicam (MOBIC) 15 MG tablet Take 1 tablet (15 mg total) by mouth daily. 08/28/17   Nils Flack, Mieshia Pepitone A, PA-C  metFORMIN (GLUCOPHAGE) 1000 MG tablet Take 1,000  mg by mouth 2 (two) times daily with a meal.    [provider]  omeprazole (PRILOSEC) 40 MG capsule Take 40 mg by mouth 2 (two) times daily. 03/24/17   [provider]  promethazine (PHENERGAN) 25 MG tablet Take 1 tablet (25 mg total) by mouth every 6 (six) hours as needed for nausea or vomiting. 03/16/17   Ward, Delice Bison, DO  ranitidine (ZANTAC) 150 MG tablet Take 1 tablet (150 mg total) by mouth 2 (two) times daily. 03/23/17   Kinnie Feil, PA-C    Family History Family History  Problem Relation Age of Onset  . Cancer Sister        lung  . Diabetes Sister   . Hypertension Sister     Social History Social History   Tobacco Use  . Smoking status: Never Smoker  . Smokeless tobacco: Current User    Types: Chew  Substance Use Topics  . Alcohol use: No  . Drug use: No     Allergies   Contrast media [iodinated diagnostic agents]; Levaquin [levofloxacin]; Other; Ponstel [mefenamic acid]; Sulfa antibiotics; Yellow dyes (non-tartrazine); Ciprofloxacin; and Zofran [ondansetron hcl]   Review of Systems Review of Systems  Constitutional: Negative for chills and fever.  Respiratory: Negative for shortness of breath.   Cardiovascular: Positive for chest pain.  Musculoskeletal: Positive for arthralgias and myalgias.  All other systems reviewed and are negative.    Physical Exam Updated Vital Signs BP 136/78   Pulse 96   Temp 98.3 F (36.8 C) (Oral)   Resp 18   Ht 5\' 1"  (1.549 m)   Wt 88.5 kg (195 lb)   SpO2 99%   BMI 36.84 kg/m   Physical Exam  Constitutional: She is oriented to person, place, and time. She appears well-developed and well-nourished. No distress.  HENT:  Head: Normocephalic and atraumatic.  Eyes: Conjunctivae and EOM are normal. Pupils are equal, round, and reactive to light. Right eye exhibits no discharge. Left eye exhibits no discharge.  Neck: Normal range of motion. Neck supple. No JVD present. No tracheal deviation present.    Cardiovascular: Normal rate, regular rhythm, normal heart sounds and intact distal pulses.  2+ radial and DP/PT pulses bl, negative Homan's bl, no LE edema  Pulmonary/Chest: Effort normal and breath sounds normal. No stridor. No respiratory distress. She has no wheezes. She has no rales. She exhibits tenderness.  Diffuse anterior chest wall tenderness with no focal tenderness.  No deformity, crepitus, paradoxical wall motion, or ecchymosis.  Equal rise and fall of chest, no increased work of breathing, speaking in full sentences without difficulty.  Abdominal: Soft. Bowel sounds are normal. She exhibits no distension and no mass. There is no  tenderness. There is no guarding. No hernia.  Well-healed midline surgical incision inferior to the umbilicus  Musculoskeletal: Normal range of motion. She exhibits tenderness. She exhibits no edema.  No midline spine tenderness but she exhibits bilateral paraspinal muscle tenderness diffusely and generalized tenderness to palpation of the joints.  There is no erythema, swelling, crepitus, or ecchymosis.  She exhibits normal range of motion of the joints.  5/5 strength of BUE and BLE major muscle groups.  Neurological: She is alert and oriented to person, place, and time. No cranial nerve deficit or sensory deficit. She exhibits normal muscle tone.  Skin: Skin is warm and dry. No erythema.  Varicose veins overlying the bilateral knees, no erythema, fluctuance, or other surrounding signs of secondary skin infection  Psychiatric: She has a normal mood and affect. Her behavior is normal.  Nursing note and vitals reviewed.    ED Treatments / Results  Labs (all labs ordered are listed, but only abnormal results are displayed) Labs Reviewed  BASIC METABOLIC PANEL - Abnormal; Notable for the following components:      Result Value   CO2 20 (*)    Glucose, Bld 197 (*)    All other components within normal limits  CBC - Abnormal; Notable for the following  components:   Platelets 424 (*)    All other components within normal limits  CBG MONITORING, ED - Abnormal; Notable for the following components:   Glucose-Capillary 157 (*)    All other components within normal limits  CBG MONITORING, ED - Abnormal; Notable for the following components:   Glucose-Capillary 270 (*)    All other components within normal limits  I-STAT TROPONIN, ED    EKG  EKG Interpretation  Date/Time:  Monday August 28 2017 12:54:38 EST Ventricular Rate:  98 PR Interval:  144 QRS Duration: 80 QT Interval:  342 QTC Calculation: 436 R Axis:   48 Text Interpretation:  Normal sinus rhythm Normal ECG No significant change since last tracing Confirmed by Blanchie Dessert [phone]) on 08/28/2017 9:08:25 PM       Radiology Dg Chest 2 View  Result Date: 08/28/2017 CLINICAL DATA:  Chest pain EXAM: CHEST  2 VIEW COMPARISON:  Chest x-ray dated 03/26/2017. FINDINGS: Heart size and mediastinal contours are within normal limits. Lungs are clear. No pleural effusion or pneumothorax seen. Osseous structures about the chest are unremarkable. IMPRESSION: No active cardiopulmonary disease. Electronically Signed   By: Franki Cabot M.D.   On: 08/28/2017 13:24    Procedures Procedures (including critical care time)  Medications Ordered in ED Medications - No data to display   Initial Impression / Assessment and Plan / ED Course  I have reviewed the triage vital signs and the nursing notes.  Pertinent labs & imaging results that were available during my care of the patient were reviewed by me and considered in my medical decision making (see chart for details).     Patient presents with complaint of chronic pain which has been ongoing and unchanged for 6 weeks.  Pain in the chest is reproducible on palpation and does not appear to be cardiac in nature.  Lab work is reassuring with no leukocytosis, normal troponin, and EKG which shows normal sinus rhythm.  No evidence of ST  segment abnormality, arrhythmia, or ischemic changes.  Chest x-ray shows no acute cardiopulmonary abnormalities.  I doubt ACS, MI, or PE.  Remainder of lab work is at patient's baseline with moderate hyperglycemia.  She is complaining of varicose veins  to the bilateral knees.  No evidence of cellulitis, abscess, or DVT.  She receives refills of Xanax chronically by her primary care provider, most recently filled 5 days ago.  She was fired from her last pain management practice after receiving Percocet from a GYN procedure.  I explained to patient that I cannot provide her with narcotic pain medicines given she is already receiving controlled substances on a regular basis from one provider.  I explained to the patient that it is imperative that she find a pain management physician to manage her chronic pain.  I provided her with outpatient resources for follow-up.  I also offered her nonnarcotic options of which she was willing to try Mobic which I explained to her has a lower potential for GI upset.  She has tolerated ibuprofen without difficulty or allergic reaction. Advised patient to stop this medication if she develops any left upper quadrant abdominal pain, emesis, or blood in her stools.  Discussed indications for return to the ED. Pt verbalized understanding of and agreement with plan and is safe for discharge home at this time.  Final Clinical Impressions(s) / ED Diagnoses   Final diagnoses:  Pain  Atypical chest pain    ED Discharge Orders        Ordered    meloxicam (MOBIC) 15 MG tablet  Daily     08/28/17 2113       Renita Papa, PA-C 08/28/17 2201    Blanchie Dessert, MD 08/29/17 2023

## 2017-08-28 NOTE — ED Notes (Signed)
Patient provided with bag lunch, diet coke and a case mgmt consult to assist in finding a new primary care provider.

## 2017-08-28 NOTE — ED Triage Notes (Signed)
Pt reports pain to entire body including arms, back, legs and chest. No acute distress is noted and ekg done at triage. Also wants eval of cysts on her knees.

## 2017-11-21 ENCOUNTER — Other Ambulatory Visit: Payer: Self-pay | Admitting: Family

## 2017-11-21 ENCOUNTER — Other Ambulatory Visit: Payer: Self-pay | Admitting: Family Medicine

## 2017-11-21 DIAGNOSIS — Z1231 Encounter for screening mammogram for malignant neoplasm of breast: Secondary | ICD-10-CM

## 2017-12-25 ENCOUNTER — Ambulatory Visit
Admission: RE | Admit: 2017-12-25 | Discharge: 2017-12-25 | Disposition: A | Discharge status: Home / Self Care | Payer: Medicare HMO | Source: Ambulatory Visit | Attending: Family Medicine | Admitting: Family Medicine

## 2017-12-25 DIAGNOSIS — Z1231 Encounter for screening mammogram for malignant neoplasm of breast: Secondary | ICD-10-CM

## 2018-10-06 IMAGING — US US PELVIS COMPLETE
1 series · 13 of 19 positions shown · non-contrast
Comparison: CT performed earlier today.

CLINICAL DATA: Right-sided pelvic pain for 2 days. Left adnexal
mass seen on CT earlier today.

History of hysterectomy.
EXAM:
TRANSABDOMINAL AND TRANSVAGINAL ULTRASOUND OF PELVIS
DOPPLER ULTRASOUND OF OVARIES
TECHNIQUE: Both transabdominal and transvaginal ultrasound examinations of the
pelvis were performed. Transabdominal technique was performed for
global imaging of the pelvis including uterus, ovaries, adnexal
regions, and pelvic cul-de-sac.
It was necessary to proceed with endovaginal exam following the
transabdominal exam to visualize the adnexal structures to an
adequate degree. Color and duplex Doppler ultrasound was utilized to
evaluate blood flow to the ovaries.

[Series 1: us pelvis complete · 0.25mm/px · 13 of 19 slices shown]
[im 1/19]
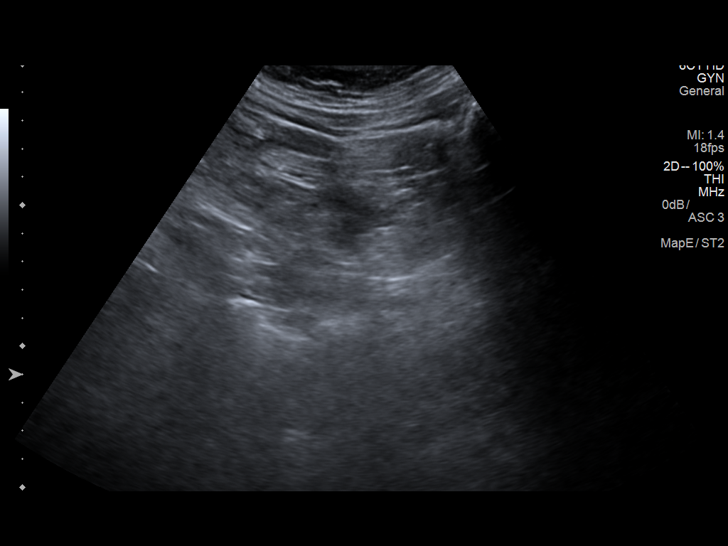
[im 3/19]
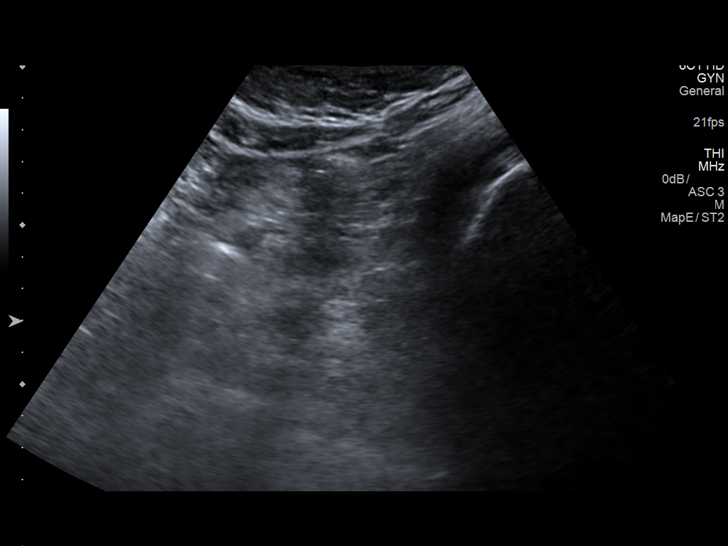
[im 4/19]
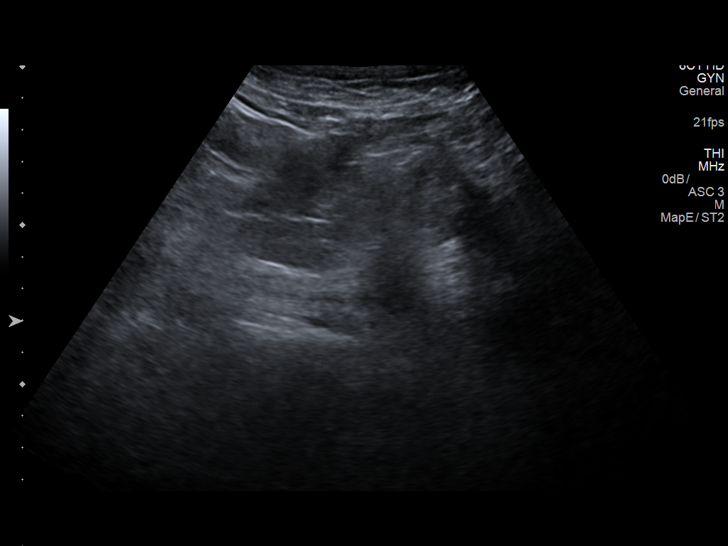
[im 6/19]
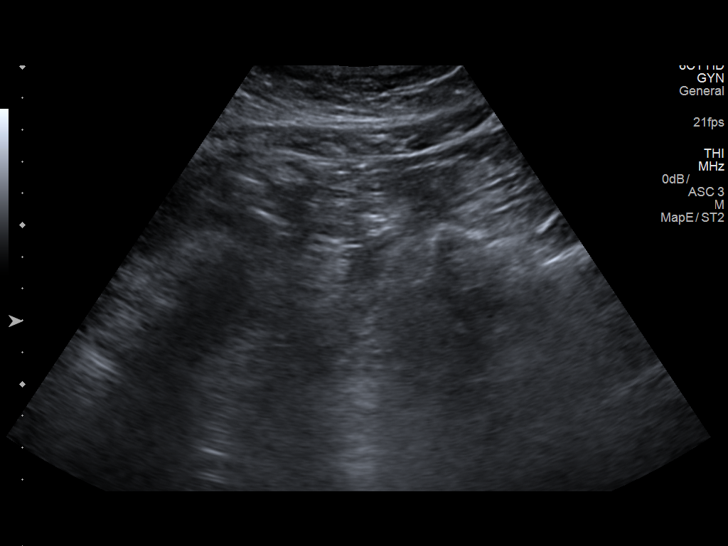
[im 7/19]
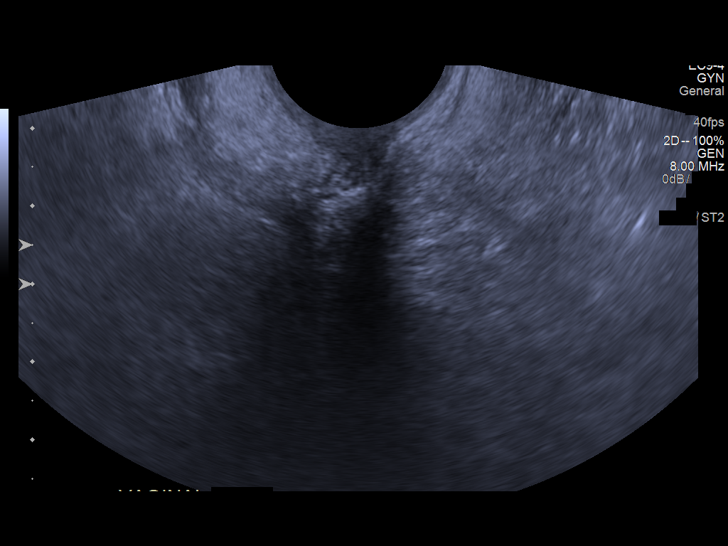
[im 9/19]
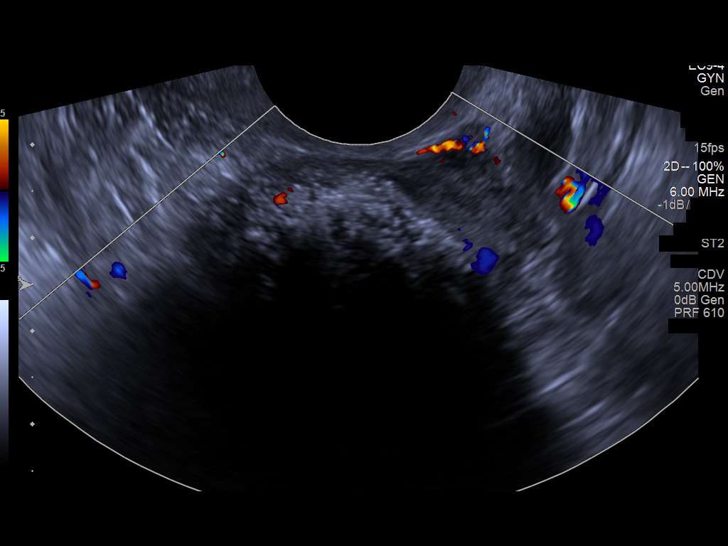
[im 10/19]
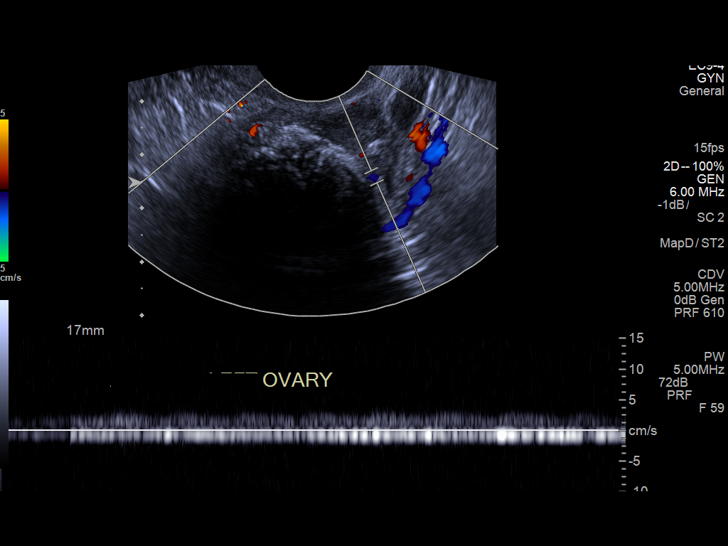
[im 11/19]
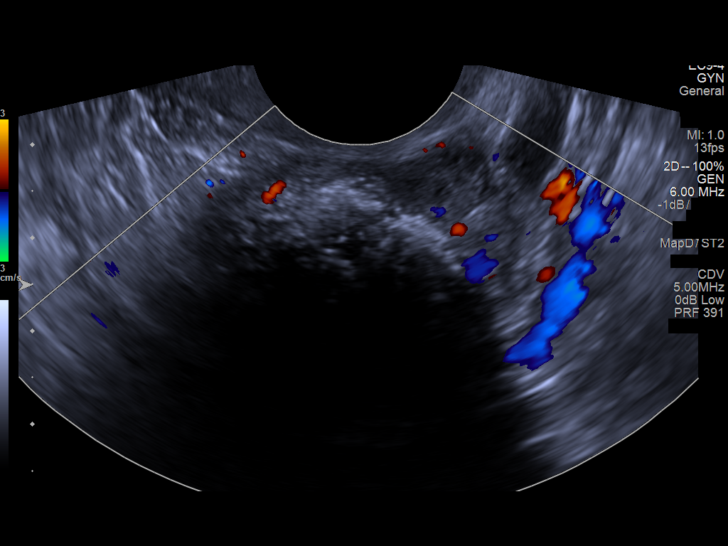
[im 13/19]
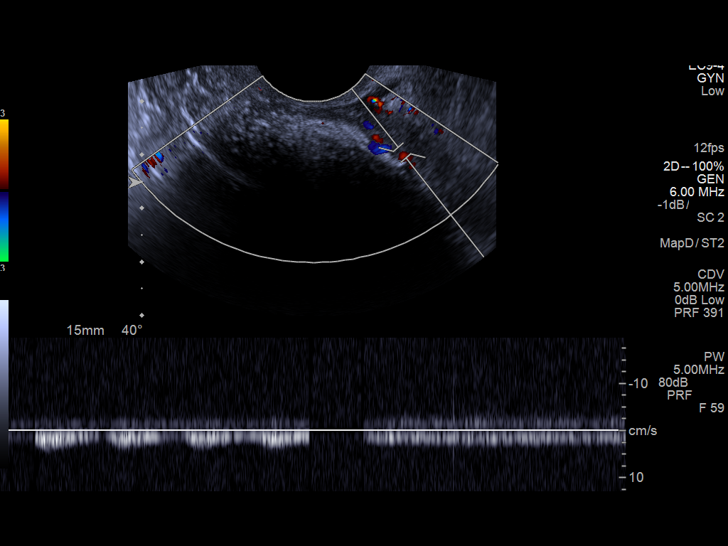
[im 14/19]
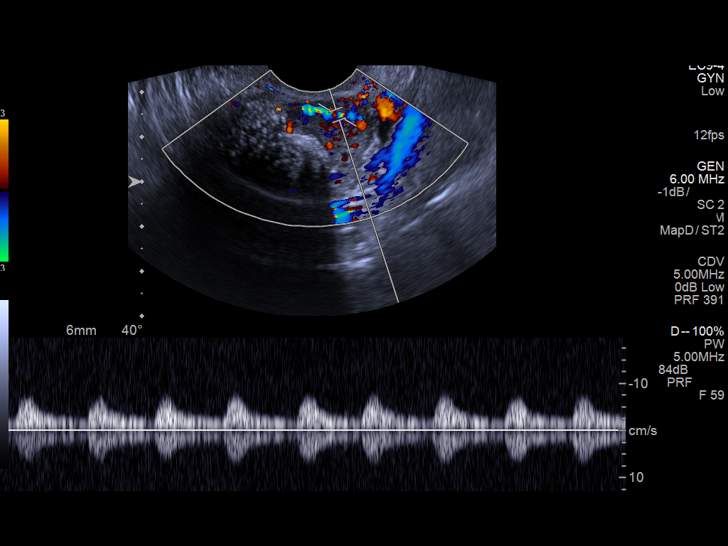
[im 16/19]
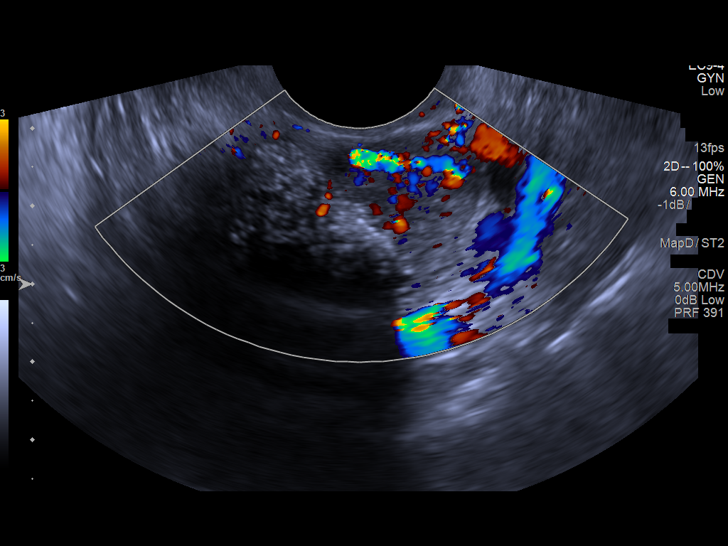
[im 17/19]
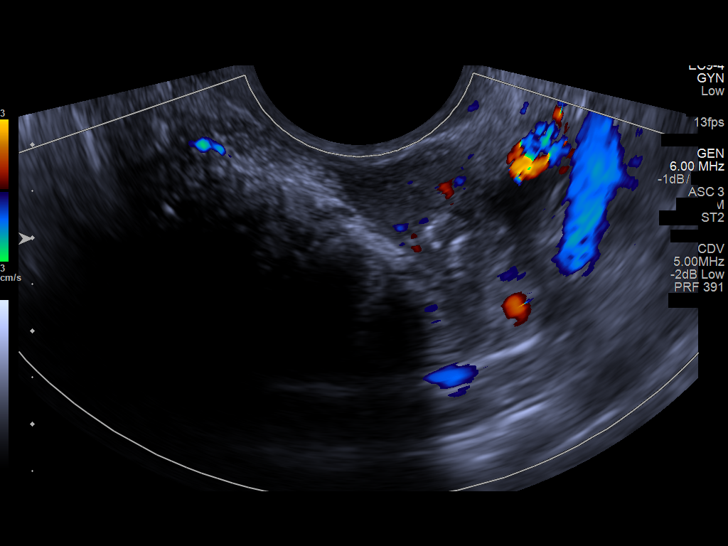
[im 19/19]
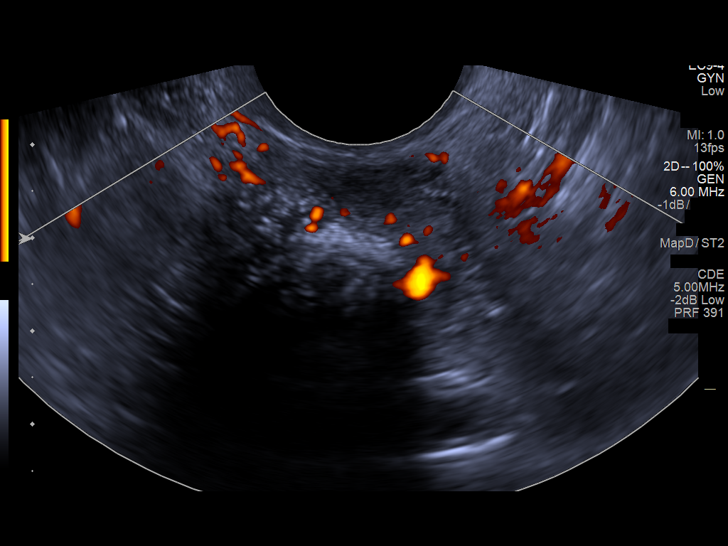

[13 of 19 positions shown; findings below may reference images not displayed]

FINDINGS: Uterus

Surgically absent. No mass or free fluid identified in the midline
pelvis.

Right ovary

Not seen but no mass or free fluid identified in the right adnexa.
CT appearance of the right adnexa is unremarkable.

Left ovary

Shadowing mass in the left adnexa measures approximately 3.4 x
cm, corresponding to the partially calcified mass identified on
earlier CT. This mass appears to be associated with the left ovary.
The combination of mass and ovary measures approximately 4.1 x 3 cm.
Normal arterial and venous waveforms are shown within the
surrounding ovarian parenchyma.

Pulsed Doppler evaluation of the left ovary demonstrates normal
low-resistance arterial and venous waveforms.

Right ovary not seen.

Other findings

No abnormal free fluid.
IMPRESSION: 1. Shadowing mass within the LEFT adnexa appears to be associated
with the LEFT ovary, measuring 3.4 x 2.6 cm, corresponding to the
partially calcified mass seen on CT performed earlier today. This
may represent an ovarian dermoid, however, there is no fat seen
within the mass on CT to confirm benignity. Neoplastic mass cannot
be excluded, such as mucinous or serous ovarian tumors which can
calcify. Recommend MRI for further characterization. Consider GYN
consult for further workup considerations, including possible
laparoscopic evaluation.
2. Uterus is surgically absent. No mass or free fluid in the midline
pelvis.
3. Right ovary not seen but no mass or free fluid identified in the
right adnexa. Right adnexal region had a normal appearance on
earlier CT.

## 2018-11-23 ENCOUNTER — Other Ambulatory Visit: Payer: Self-pay | Admitting: Family Medicine

## 2018-11-23 DIAGNOSIS — Z1231 Encounter for screening mammogram for malignant neoplasm of breast: Secondary | ICD-10-CM

## 2019-01-14 ENCOUNTER — Ambulatory Visit
Admission: RE | Admit: 2019-01-14 | Discharge: 2019-01-14 | Disposition: A | Discharge status: Home / Self Care | Payer: Medicare HMO | Source: Ambulatory Visit | Attending: Family Medicine | Admitting: Family Medicine

## 2019-01-14 DIAGNOSIS — Z1231 Encounter for screening mammogram for malignant neoplasm of breast: Secondary | ICD-10-CM

## 2019-08-05 ENCOUNTER — Ambulatory Visit: Payer: Medicare HMO | Attending: Internal Medicine

## 2019-08-05 DIAGNOSIS — Z20822 Contact with and (suspected) exposure to covid-19: Secondary | ICD-10-CM

## 2019-08-06 LAB — NOVEL CORONAVIRUS, NAA: SARS-CoV-2, NAA: DETECTED — AB

## 2019-08-07 ENCOUNTER — Telehealth: Payer: Self-pay | Admitting: Nurse Practitioner

## 2019-08-07 NOTE — Telephone Encounter (Signed)
Called to Discuss with patient about Covid symptoms and the use of bamlanivimab, a monoclonal antibody infusion for those with mild to moderate Covid symptoms and at a high risk of hospitalization.     Pt states that she is better and is no longer having any symptoms.

## 2019-11-21 ENCOUNTER — Other Ambulatory Visit: Payer: Self-pay | Admitting: Family Medicine

## 2019-11-21 DIAGNOSIS — Z1231 Encounter for screening mammogram for malignant neoplasm of breast: Secondary | ICD-10-CM

## 2019-11-27 ENCOUNTER — Other Ambulatory Visit: Payer: Self-pay | Admitting: Physician Assistant

## 2019-11-27 DIAGNOSIS — Z780 Asymptomatic menopausal state: Secondary | ICD-10-CM

## 2020-01-15 ENCOUNTER — Other Ambulatory Visit: Payer: Self-pay

## 2020-01-15 ENCOUNTER — Ambulatory Visit: Payer: Medicare HMO

## 2020-01-15 ENCOUNTER — Ambulatory Visit
Admission: RE | Admit: 2020-01-15 | Discharge: 2020-01-15 | Disposition: A | Discharge status: Home / Self Care | Payer: Medicare HMO | Source: Ambulatory Visit | Attending: Family Medicine | Admitting: Family Medicine

## 2020-01-15 ENCOUNTER — Ambulatory Visit
Admission: RE | Admit: 2020-01-15 | Discharge: 2020-01-15 | Disposition: A | Discharge status: Home / Self Care | Payer: Medicare HMO | Source: Ambulatory Visit | Attending: Physician Assistant | Admitting: Physician Assistant

## 2020-01-15 DIAGNOSIS — Z780 Asymptomatic menopausal state: Secondary | ICD-10-CM

## 2020-01-15 DIAGNOSIS — Z1231 Encounter for screening mammogram for malignant neoplasm of breast: Secondary | ICD-10-CM

## 2020-08-20 ENCOUNTER — Encounter: Payer: Self-pay | Admitting: Endocrinology

## 2020-12-14 ENCOUNTER — Other Ambulatory Visit: Payer: Self-pay | Admitting: Physician Assistant

## 2020-12-14 DIAGNOSIS — Z1231 Encounter for screening mammogram for malignant neoplasm of breast: Secondary | ICD-10-CM

## 2020-12-21 ENCOUNTER — Other Ambulatory Visit: Payer: Self-pay | Admitting: Physician Assistant

## 2021-02-05 ENCOUNTER — Other Ambulatory Visit: Payer: Self-pay

## 2021-02-05 ENCOUNTER — Ambulatory Visit
Admission: RE | Admit: 2021-02-05 | Discharge: 2021-02-05 | Disposition: A | Discharge status: Home / Self Care | Payer: Medicare HMO | Source: Ambulatory Visit | Attending: Physician Assistant | Admitting: Physician Assistant

## 2021-02-05 DIAGNOSIS — Z1231 Encounter for screening mammogram for malignant neoplasm of breast: Secondary | ICD-10-CM

## 2021-09-08 IMAGING — MG MM DIGITAL SCREENING BILAT W/ TOMO AND CAD
6 of 12 series · 6 of 36 positions shown · non-contrast
Comparison: Previous exam(s).

CLINICAL DATA: Screening.

EXAM:
DIGITAL SCREENING BILATERAL MAMMOGRAM WITH TOMOSYNTHESIS AND CAD
TECHNIQUE: Bilateral screening digital craniocaudal and mediolateral oblique
mammograms were obtained. Bilateral screening digital breast
tomosynthesis was performed. The images were evaluated with
computer-aided detection.

[L CC synth-2D (1 of 2)]
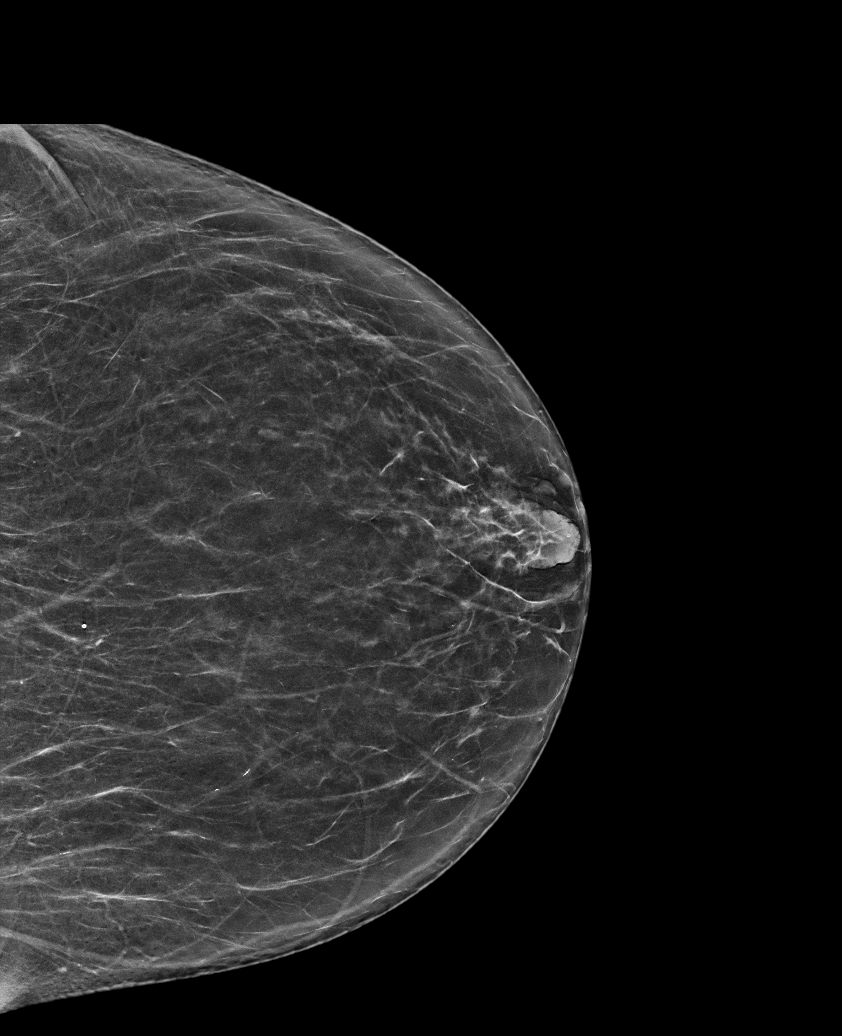

[R MLO synth-2D]
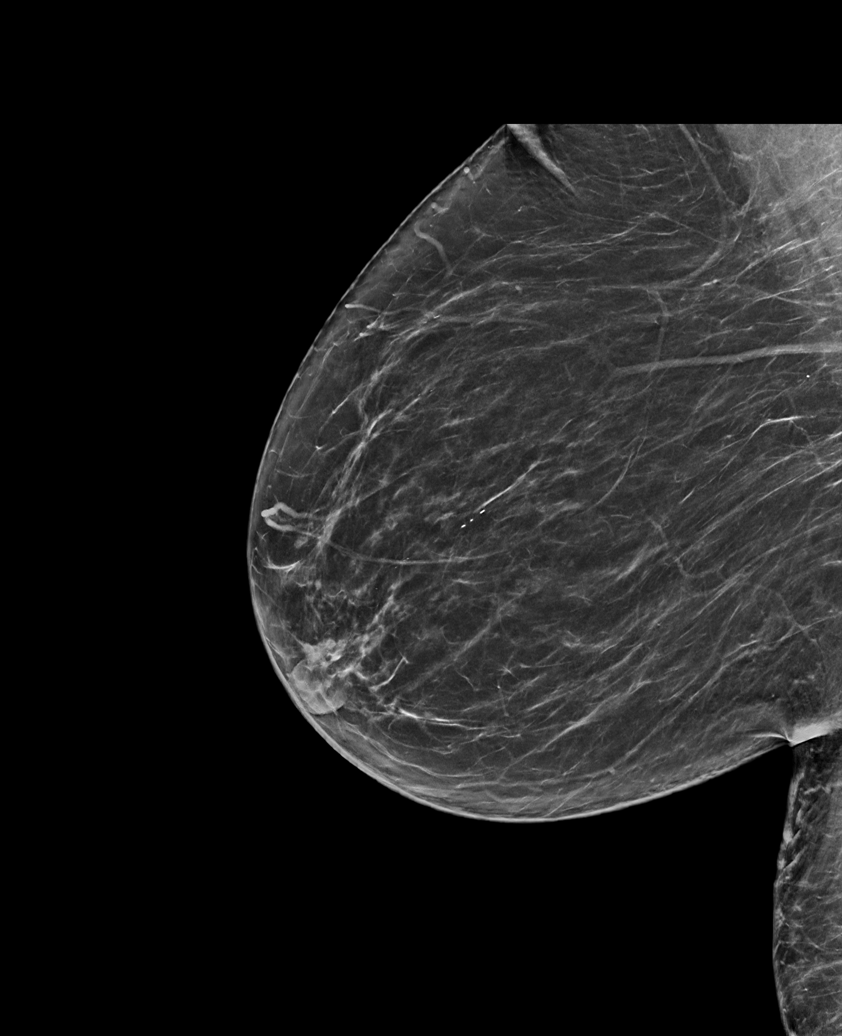

[R CC synth-2D (1 of 2)]
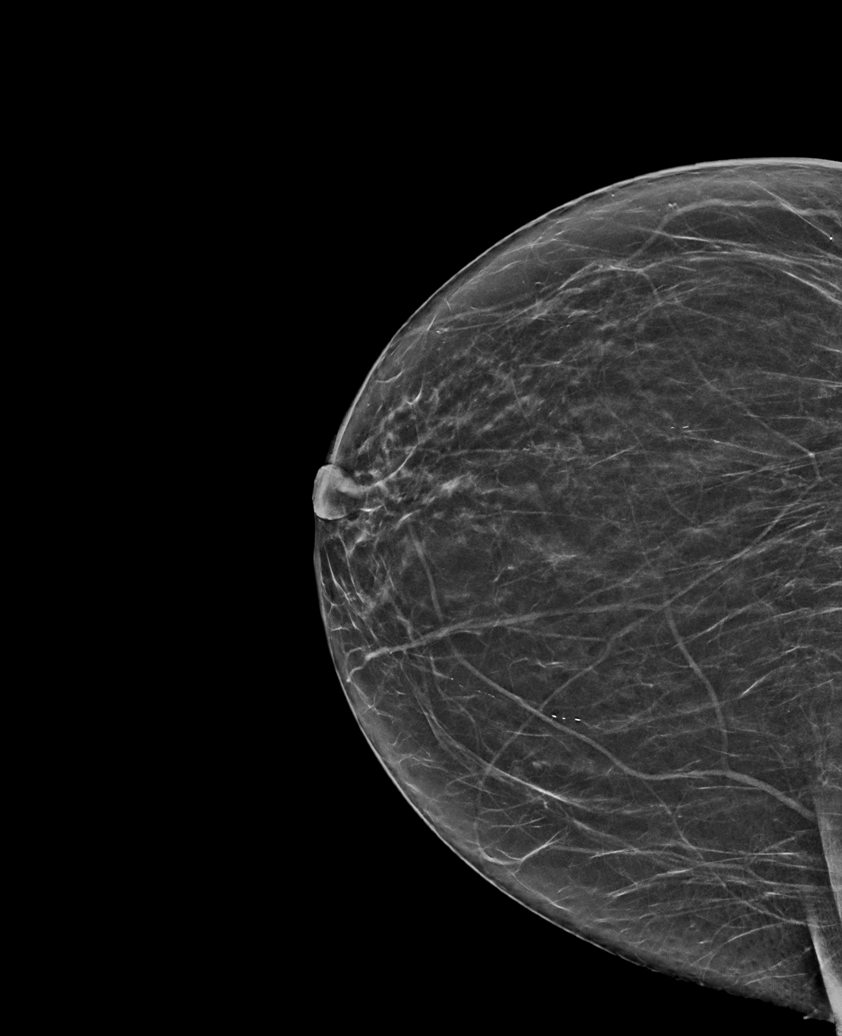

[R CC synth-2D (2 of 2)]
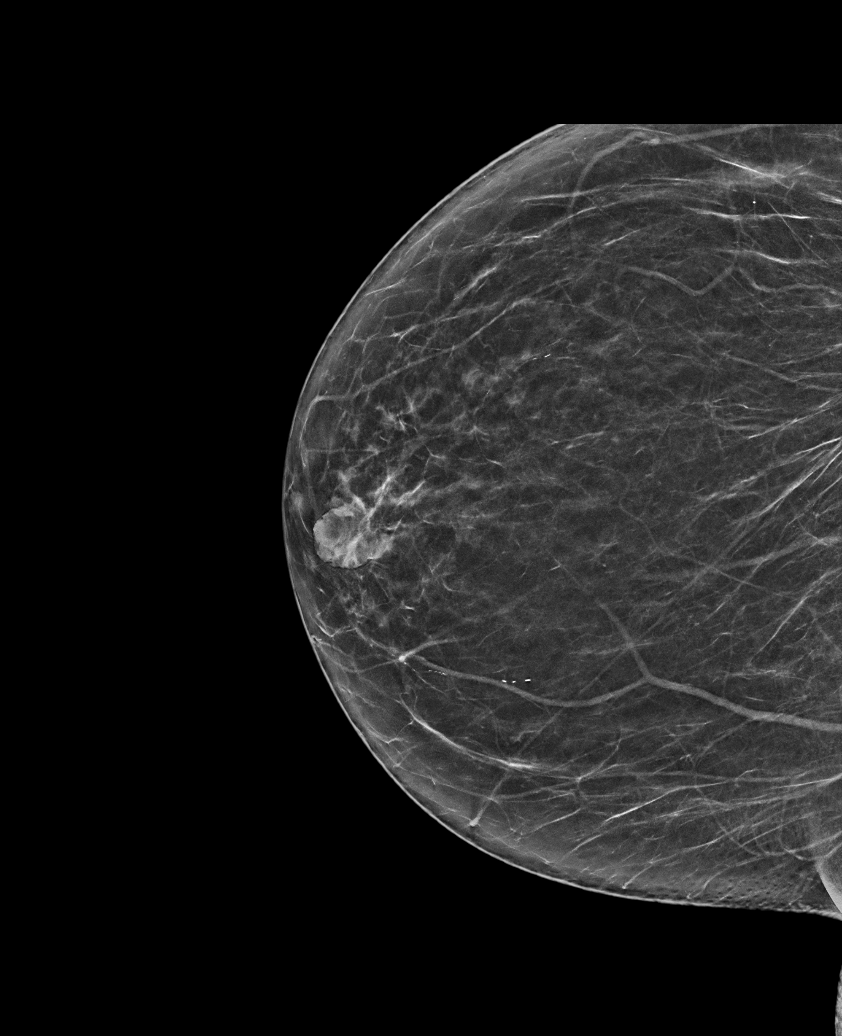

[L MLO synth-2D]
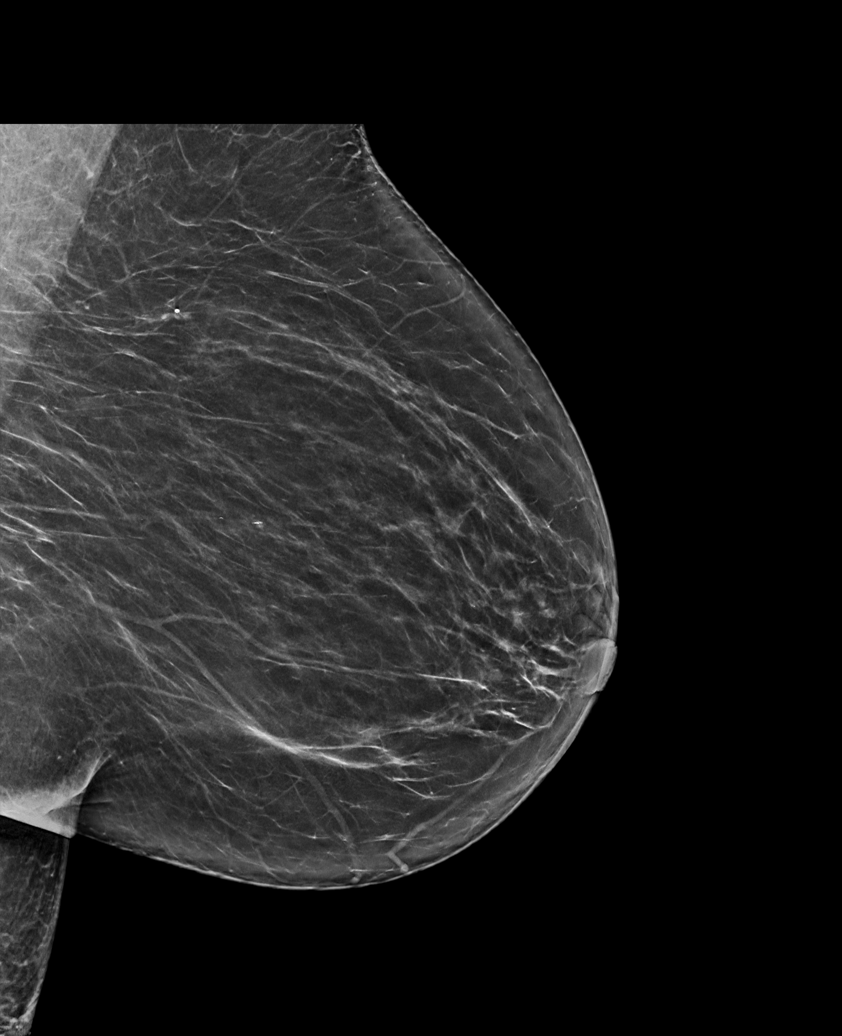

[L CC synth-2D (2 of 2)]
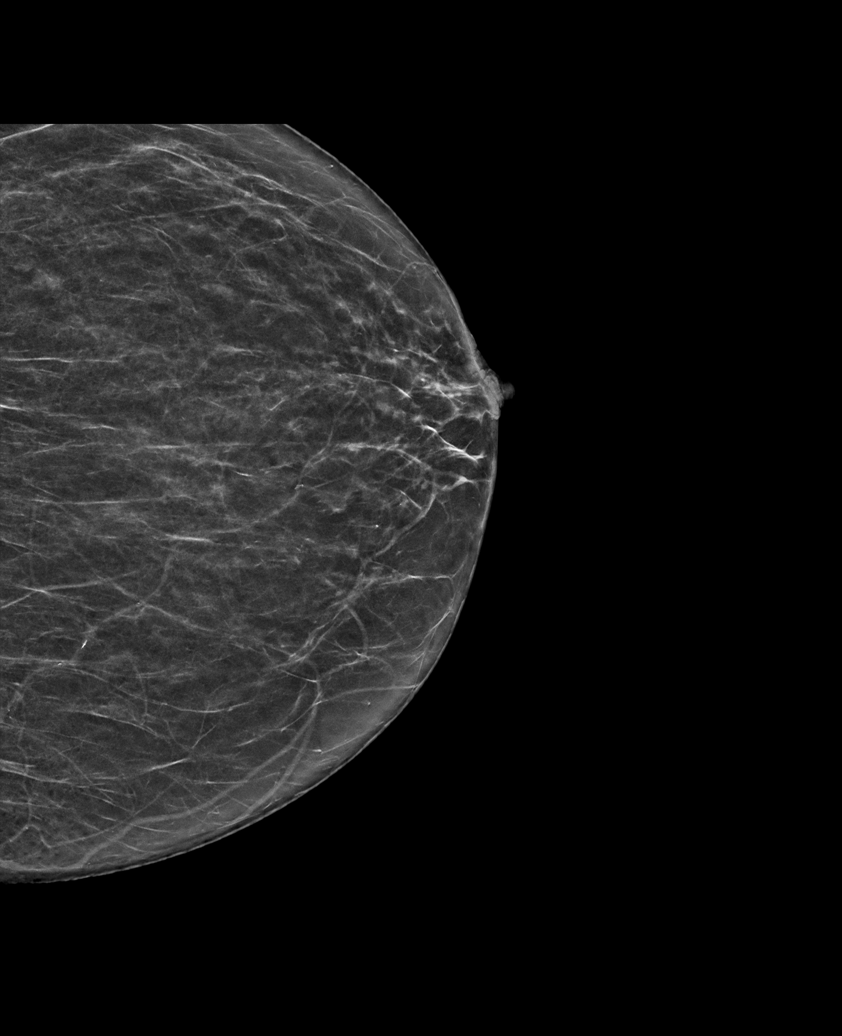

[6 of 36 positions shown; findings below may reference images not displayed]

ACR Breast Density Category b: There are scattered areas of
fibroglandular density.
FINDINGS: There are no findings suspicious for malignancy.
IMPRESSION: No mammographic evidence of malignancy. A result letter of this
screening mammogram will be mailed directly to the patient.

RECOMMENDATION:
Screening mammogram in one year. (Code:51-O-LD2)

BI-RADS CATEGORY  1: Negative.

## 2021-11-17 ENCOUNTER — Other Ambulatory Visit: Payer: Self-pay | Admitting: Physician Assistant

## 2021-11-17 ENCOUNTER — Other Ambulatory Visit: Payer: Self-pay | Admitting: Registered Nurse

## 2021-11-17 DIAGNOSIS — Z1231 Encounter for screening mammogram for malignant neoplasm of breast: Secondary | ICD-10-CM

## 2022-02-07 ENCOUNTER — Ambulatory Visit
Admission: RE | Admit: 2022-02-07 | Discharge: 2022-02-07 | Disposition: A | Discharge status: Home / Self Care | Payer: Medicare Other | Source: Ambulatory Visit | Attending: Registered Nurse | Admitting: Registered Nurse

## 2022-02-07 DIAGNOSIS — Z1231 Encounter for screening mammogram for malignant neoplasm of breast: Secondary | ICD-10-CM

## 2022-02-16 ENCOUNTER — Emergency Department (HOSPITAL_COMMUNITY)
Admission: EM | Admit: 2022-02-16 | Discharge: 2022-02-16 | Disposition: A | Discharge status: Home / Self Care | Payer: Medicare Other | Attending: Emergency Medicine | Admitting: Emergency Medicine

## 2022-02-16 ENCOUNTER — Encounter (HOSPITAL_COMMUNITY): Payer: Self-pay

## 2022-02-16 ENCOUNTER — Other Ambulatory Visit: Payer: Self-pay

## 2022-02-16 DIAGNOSIS — Z7982 Long term (current) use of aspirin: Secondary | ICD-10-CM | POA: Insufficient documentation

## 2022-02-16 DIAGNOSIS — M542 Cervicalgia: Secondary | ICD-10-CM | POA: Diagnosis not present

## 2022-02-16 MED ORDER — DEXAMETHASONE SODIUM PHOSPHATE 10 MG/ML IJ SOLN
8.0000 mg | Freq: Once | INTRAMUSCULAR | Status: AC
  Administered 2022-02-16: 8 mg via INTRAMUSCULAR
  Filled 2022-02-16: qty 1

## 2022-02-16 MED ORDER — KETOROLAC TROMETHAMINE 30 MG/ML IJ SOLN
30.0000 mg | Freq: Once | INTRAMUSCULAR | Status: AC
  Administered 2022-02-16: 30 mg via INTRAMUSCULAR
  Filled 2022-02-16: qty 1

## 2022-02-16 NOTE — Discharge Instructions (Signed)
Please contact your primary care doctor's office for follow-up visit for your neck pain.  If your symptoms persist you may need an MRI of the neck or cervical spine, to look at your nerves and discs.

## 2022-02-16 NOTE — ED Triage Notes (Signed)
Pt c/o pain to the right side of her neck since Sunday. States the pain will radiate into her shoulder and right upper back. Pt denies injury.

## 2022-02-16 NOTE — ED Provider Notes (Signed)
[facility] DEPT Provider Note   CSN: [id number] Arrival date & time: 02/16/22  3335     History  Chief Complaint  Patient presents with   Neck Pain    Sarah Webster is a 66 y.o. female presenting to ED with right-sided neck pain, she reports she woke up this morning felt she slept on her neck wrong, she has pain on the right lower posterior aspect of her neck that radiates down into her right shoulder, worse with any movement.  No pain radiating down into her arm.  No falls or trauma.  She takes several medicines already for chronic pain, including Norco, gabapentin 400 mg twice daily, and Xanax.  She has an extensive list of allergies.  She says she does tolerate ibuprofen.  HPI     Home Medications Prior to Admission medications   Medication Sig Start Date End Date Taking? Authorizing Provider  ALPRAZolam (XANAX) 0.25 MG tablet Take 0.25 mg by mouth 2 (two) times daily.     [provider]  aspirin EC 81 MG tablet Take 81 mg by mouth daily.    [provider]  cholecalciferol (VITAMIN D) 1000 units tablet Take 1,000 Units by mouth daily.    [provider]  Cyanocobalamin (VITAMIN B-12) 3000 MCG SUBL Place 3,000 mcg under the tongue daily.     [provider]  dapagliflozin propanediol (FARXIGA) 10 MG TABS tablet Take 10 mg by mouth daily.    [provider]  ezetimibe-simvastatin (VYTORIN) 10-20 MG per tablet Take 1 tablet by mouth daily.     [provider]  ibuprofen (ADVIL,MOTRIN) 800 MG tablet Take 800 mg by mouth 3 (three) times daily as needed (pain).  03/19/17   [provider]  irbesartan-hydrochlorothiazide (AVALIDE) 150-12.5 MG tablet Take 0.5 tablets by mouth daily before breakfast.  02/20/17   [provider]  meloxicam (MOBIC) 15 MG tablet Take 1 tablet (15 mg total) by mouth daily. 08/28/17   Nils Flack, Mina A, PA-C  metFORMIN (GLUCOPHAGE) 1000 MG tablet Take 1,000 mg by  mouth 2 (two) times daily with a meal.    [provider]  omeprazole (PRILOSEC) 40 MG capsule Take 40 mg by mouth 2 (two) times daily. 03/24/17   [provider]  promethazine (PHENERGAN) 25 MG tablet Take 1 tablet (25 mg total) by mouth every 6 (six) hours as needed for nausea or vomiting. 03/16/17   Ward, Delice Bison, DO  ranitidine (ZANTAC) 150 MG tablet Take 1 tablet (150 mg total) by mouth 2 (two) times daily. 03/23/17   Kinnie Feil, PA-C      Allergies    Contrast media [iodinated contrast media], Levaquin [levofloxacin], Other, Ponstel [mefenamic acid], Sulfa antibiotics, Yellow dyes (non-tartrazine), Ciprofloxacin, and Zofran [ondansetron hcl]    Review of Systems   Review of Systems  Physical Exam Updated Vital Signs BP (!) 152/100 (BP Location: Right Arm)   Pulse (!) 105   Temp 99 F (37.2 C) (Oral)   Resp 18   Wt 83.5 kg   SpO2 100%   BMI 34.77 kg/m  Physical Exam Constitutional:      General: She is not in acute distress. HENT:     Head: Normocephalic and atraumatic.  Eyes:     Conjunctiva/sclera: Conjunctivae normal.     Pupils: Pupils are equal, round, and reactive to light.  Cardiovascular:     Rate and Rhythm: Normal rate and regular rhythm.  Pulmonary:  Effort: Pulmonary effort is normal. No respiratory distress.  Musculoskeletal:     Comments: Trigger point tenderness along the right base of the posterior neck and below the right scapula  Skin:    General: Skin is warm and dry.  Neurological:     General: No focal deficit present.     Mental Status: She is alert and oriented to person, place, and time. Mental status is at baseline.     Sensory: No sensory deficit.     Motor: No weakness.  Psychiatric:        Mood and Affect: Mood normal.        Behavior: Behavior normal.     ED Results / Procedures / Treatments   Labs (all labs ordered are listed, but only abnormal results are displayed) Labs Reviewed - No data to  display  EKG None  Radiology No results found.  Procedures Procedures    Medications Ordered in ED Medications  dexamethasone (DECADRON) injection 8 mg (has no administration in time range)  ketorolac (TORADOL) 30 MG/ML injection 30 mg (has no administration in time range)    ED Course/ Medical Decision Making/ A&P                           Medical Decision Making Risk Prescription drug management.   Patient is here with neck pain which is strongly suspect is musculoskeletal, may be related to disc herniation versus some radiculopathy, with likely some muscular component with trigger point tenderness.  I have a lower suspicion for vascular dissection or acute fracture, do not believe she needs emergent imaging of the neck at this time.  No red flags for cord compression, no weakness in the arm.  No radiculopathy.  We reviewed her extensive list of allergies, pain control may be challenging for her.  We decided on a single dose of Decadron (she is diabetic on insulin but I think a single shot of intramuscular would likely not cause long-term derangements with her sugars), as well as a shot of Toradol.  I recommended heating packs and muscle rubs for her neck and shoulder.  Finally recommended to contact her primary care doctor's office, if her symptoms persist, she would likely need an MRI of the cervical spine not emergently as an outpatient.  She verbalized understanding.  I also spoke to her granddaughter on the phone who provided supplemental history, and updated her on the plan.  Okay for discharge at this time  I also recommended she can increase her gabapentin from twice daily to 3 times daily.  400 mg seems an appropriate dose at this point.  Muscle relaxers unfortunately are flagged due to anaphylactic allergies with "yellow dye" concern for cross-reactivity.         Final Clinical Impression(s) / ED Diagnoses Final diagnoses:  Neck pain    Rx / DC Orders ED  Discharge Orders     None         Travante Knee, Carola Rhine, MD 02/16/22 [phone]

## 2022-05-05 ENCOUNTER — Other Ambulatory Visit (HOSPITAL_COMMUNITY): Payer: Self-pay | Admitting: Internal Medicine

## 2022-05-05 DIAGNOSIS — I739 Peripheral vascular disease, unspecified: Secondary | ICD-10-CM

## 2022-05-05 DIAGNOSIS — I779 Disorder of arteries and arterioles, unspecified: Secondary | ICD-10-CM

## 2022-05-24 ENCOUNTER — Ambulatory Visit (HOSPITAL_COMMUNITY)
Admission: RE | Admit: 2022-05-24 | Discharge: 2022-05-24 | Disposition: A | Discharge status: Home / Self Care | Payer: Medicare Other | Source: Ambulatory Visit | Attending: Internal Medicine | Admitting: Internal Medicine

## 2022-05-24 ENCOUNTER — Encounter (HOSPITAL_COMMUNITY): Payer: Self-pay

## 2022-05-24 ENCOUNTER — Ambulatory Visit (HOSPITAL_BASED_OUTPATIENT_CLINIC_OR_DEPARTMENT_OTHER)
Admission: RE | Admit: 2022-05-24 | Discharge: 2022-05-24 | Disposition: A | Discharge status: Home / Self Care | Payer: Medicare Other | Source: Ambulatory Visit | Attending: Internal Medicine | Admitting: Internal Medicine

## 2022-05-24 DIAGNOSIS — I779 Disorder of arteries and arterioles, unspecified: Secondary | ICD-10-CM | POA: Diagnosis not present

## 2022-05-24 DIAGNOSIS — I739 Peripheral vascular disease, unspecified: Secondary | ICD-10-CM | POA: Diagnosis present

## 2023-02-27 ENCOUNTER — Ambulatory Visit: Payer: Medicare HMO | Admitting: Cardiology

## 2023-02-27 ENCOUNTER — Encounter: Payer: Self-pay | Admitting: Cardiology

## 2023-02-27 VITALS — BP 131/76 | HR 77 | Resp 16 | Ht 61.0 in | Wt 173.0 lb

## 2023-02-27 DIAGNOSIS — R0609 Other forms of dyspnea: Secondary | ICD-10-CM

## 2023-02-27 DIAGNOSIS — I739 Peripheral vascular disease, unspecified: Secondary | ICD-10-CM

## 2023-02-27 DIAGNOSIS — E1159 Type 2 diabetes mellitus with other circulatory complications: Secondary | ICD-10-CM

## 2023-02-27 NOTE — Progress Notes (Signed)
Patient referred by Loura Back, NP for dyspnea  Subjective:   Sarah Webster, female    DOB: 1956-04-23, 67 y.o.   MRN: [id number]   Chief Complaint  Patient presents with   PAD   New Patient (Initial Visit)     HPI  67 y.o. African-American female with hypertension, type 2 diabetes mellitus, exertional dyspnea  Patient lives by herself, gets around with a cane.  She denies any chest pain, has exertional dyspnea with walking uphill, but is relatively unchanged over period of time.  She underwent vascular ultrasound that showed normal ABI, but suggested lower pressures in her right arm with monophasic flow.  She denies any tingling numbness with activity.  She also denies any dizziness, syncope episodes.    Past Medical History:  Diagnosis Date   Anxiety    Arthritis    Diabetes mellitus    GERD (gastroesophageal reflux disease)    Hypertension    PONV (postoperative nausea and vomiting)      Past Surgical History:  Procedure Laterality Date   ABDOMINAL HYSTERECTOMY     CARPAL TUNNEL RELEASE     HAND SURGERY       Social History   Tobacco Use  Smoking Status Never  Smokeless Tobacco Current   Types: Chew, Snuff    Social History   Substance and Sexual Activity  Alcohol Use No     Family History  Problem Relation Age of Onset   Heart disease Sister    Cancer Sister        lung   Diabetes Sister    Hypertension Sister       Current Outpatient Medications:    aspirin EC 81 MG tablet, Take 81 mg by mouth daily., Disp: , Rfl:    cholecalciferol (VITAMIN D) 1000 units tablet, Take 1,000 Units by mouth daily., Disp: , Rfl:    Cyanocobalamin (VITAMIN B-12) 3000 MCG SUBL, Place 3,000 mcg under the tongue daily. , Disp: , Rfl:    dapagliflozin propanediol (FARXIGA) 10 MG TABS tablet, Take 10 mg by mouth daily., Disp: , Rfl:    Dulaglutide (TRULICITY) 4.5 MG/0.5ML SOPN, as directed Subcutaneous, Disp: , Rfl:    ezetimibe-simvastatin (VYTORIN) 10-20 MG  per tablet, Take 1 tablet by mouth daily. , Disp: , Rfl:    gabapentin (NEURONTIN) 400 MG capsule, Take 400 mg by mouth 2 (two) times daily., Disp: , Rfl:    HYDROcodone-acetaminophen (NORCO) 10-325 MG tablet, Take 0.5 tablets by mouth 4 (four) times daily as needed., Disp: , Rfl:    meloxicam (MOBIC) 15 MG tablet, Take 1 tablet (15 mg total) by mouth daily., Disp: 10 tablet, Rfl: 0   metFORMIN (GLUCOPHAGE) 1000 MG tablet, Take 1,000 mg by mouth 2 (two) times daily with a meal., Disp: , Rfl:    omeprazole (PRILOSEC) 40 MG capsule, Take 40 mg by mouth 2 (two) times daily., Disp: , Rfl: 0   pyridoxine (B-6) 100 MG tablet, Take 100 mg by mouth daily., Disp: , Rfl:    valsartan-hydrochlorothiazide (DIOVAN-HCT) 160-12.5 MG tablet, Take 1 tablet by mouth daily., Disp: , Rfl:    ALPRAZolam (XANAX) 0.25 MG tablet, Take 0.25 mg by mouth 2 (two) times daily.  (Patient not taking: Reported on 02/27/2023), Disp: , Rfl:    Cardiovascular and other pertinent studies:  Reviewed external labs and tests, independently interpreted  EKG 02/27/2023: Sinus rhythm 75 bpm Normal EKG  ABI 05/24/2022: Right brachial artery is monophasic and is 48 mmHg lower  than left  brachial pressure indicative of a more proximal obstruction. Recommend  obtaining NBP from LUE only.  Right: Resting right ankle-brachial index is within normal range. The  right toe-brachial index is normal.   Left: Resting left ankle-brachial index is within normal range. The left  toe-brachial index is normal.    Recent labs: Not available    Review of Systems  Cardiovascular:  Positive for dyspnea on exertion. Negative for chest pain, leg swelling, palpitations and syncope.         Vitals:   02/27/23 0818  BP: 123/81  Pulse: 81  Resp: 16  SpO2: 96%   Vitals:   02/27/23 0837 02/27/23 0838  BP: 136/82 (Left arm) 131/76 (Right arm)  Pulse: 83 77  Resp:    SpO2:       Body mass index is 32.69 kg/m. Filed Weights    02/27/23 0818  Weight: 173 lb (78.5 kg)     Objective:   Physical Exam Vitals and nursing note reviewed.  Constitutional:      General: She is not in acute distress. Neck:     Vascular: No JVD.  Cardiovascular:     Rate and Rhythm: Normal rate and regular rhythm.     Pulses:          Radial pulses are 1+ on the right side and 2+ on the left side.       Dorsalis pedis pulses are 2+ on the right side and 2+ on the left side.       Posterior tibial pulses are 2+ on the right side and 2+ on the left side.     Heart sounds: Normal heart sounds. No murmur heard. Pulmonary:     Effort: Pulmonary effort is normal.     Breath sounds: Normal breath sounds. No wheezing or rales.  Musculoskeletal:     Right lower leg: No edema.     Left lower leg: No edema.        Visit diagnoses:   ICD-10-CM   1. PAD (peripheral artery disease) (HCC)  I73.9 EKG 12-Lead    2. Type 2 diabetes mellitus with other circulatory complication, without long-term current use of insulin (HCC)  E11.59 CBC    Basic metabolic panel    HgB A1c    Lipid panel    3. Exertional dyspnea  R06.09 PCV ECHOCARDIOGRAM COMPLETE       Orders Placed This Encounter  Procedures   EKG 12-Lead     Medication changes this visit: Medications Discontinued During This Encounter  Medication Reason   ibuprofen (ADVIL,MOTRIN) 800 MG tablet    promethazine (PHENERGAN) 25 MG tablet    ranitidine (ZANTAC) 150 MG tablet    irbesartan-hydrochlorothiazide (AVALIDE) 150-12.5 MG tablet       Assessment & Recommendations:   67 y.o. African-American female with hypertension, type 2 diabetes mellitus, exertional dyspnea  Exertional dyspnea: Physical exam unremarkable.  Will check echocardiogram.  Low suspicion for obstructive CAD.  Given her diabetes, risk factors, and absence of bleeding, reasonable to continue aspirin 81 mg daily, along with statin.  Reduced blood pressure right arm: Monophasic flow noted on ABI with blood  pressures in right arm than left arm.  Today, her blood pressures in both arms are not significantly different.  Radial pulses slightly weaker in right arm.  However, she does not have any symptoms of arm claudication, or symptoms to suggest subclavian artery steal syndrome.  I do not hear any obvious murmur in right  subclavian area.  Therefore, I do not think she needs any further workup for this at this time.  Further recommendations after above testing.  Thank you for referring the patient to [place]. Please feel free to contact with any questions.   Elder Negus, MD Pager: [phone] Office: [phone]

## 2023-02-28 LAB — BASIC METABOLIC PANEL
BUN/Creatinine Ratio: 22 (ref 12–28)
BUN: 19 mg/dL (ref 8–27)
CO2: 19 mmol/L — ABNORMAL LOW (ref 20–29)
Calcium: 10.2 mg/dL (ref 8.7–10.3)
Chloride: 101 mmol/L (ref 96–106)
Creatinine, Ser: 0.87 mg/dL (ref 0.57–1.00)
Glucose: 260 mg/dL — ABNORMAL HIGH (ref 70–99)
Potassium: 4.4 mmol/L (ref 3.5–5.2)
Sodium: 135 mmol/L (ref 134–144)
eGFR: 73 mL/min/{1.73_m2} (ref >59)

## 2023-02-28 LAB — LIPID PANEL
Chol/HDL Ratio: 3.4 ratio (ref 0.0–4.4)
Cholesterol, Total: 169 mg/dL (ref 100–199)
HDL: 49 mg/dL (ref >39)
LDL Chol Calc (NIH): 99 mg/dL (ref 0–99)
Triglycerides: 114 mg/dL (ref 0–149)
VLDL Cholesterol Cal: 21 mg/dL (ref 5–40)

## 2023-02-28 LAB — CBC
Hematocrit: 37.1 % (ref 34.0–46.6)
Hemoglobin: 12.6 g/dL (ref 11.1–15.9)
MCH: 27 pg (ref 26.6–33.0)
MCHC: 34 g/dL (ref 31.5–35.7)
MCV: 80 fL (ref 79–97)
Platelets: 397 10*3/uL (ref 150–450)
RBC: 4.66 x10E6/uL (ref 3.77–5.28)
RDW: 13.2 % (ref 11.7–15.4)
WBC: 7.6 10*3/uL (ref 3.4–10.8)

## 2023-02-28 LAB — HEMOGLOBIN A1C
Est. average glucose Bld gHb Est-mCnc: 318 mg/dL
Hgb A1c MFr Bld: 12.7 % — ABNORMAL HIGH (ref 4.8–5.6)

## 2023-02-28 NOTE — Progress Notes (Signed)
Called and spoke to patient she voiced understanding

## 2023-02-28 NOTE — Progress Notes (Signed)
Diabetes is very uncontrolled. Please discuss with PCP at the earliest re: diabetes management.  Thanks MJP

## 2023-04-04 ENCOUNTER — Telehealth: Payer: Self-pay | Admitting: Cardiology

## 2023-04-04 NOTE — Telephone Encounter (Signed)
Patient is calling to schedule her Echo. Patient stated there was an issue with her insurance last week and she had to cancel. Patient would like to make sure her insurance will cover the Echo before scheduling. Please advise.

## 2023-04-05 ENCOUNTER — Other Ambulatory Visit: Payer: Medicare HMO

## 2023-08-10 ENCOUNTER — Ambulatory Visit: Payer: Self-pay | Admitting: Cardiology

## 2023-08-14 ENCOUNTER — Ambulatory Visit: Payer: 59 | Attending: Cardiology | Admitting: Cardiology

## 2023-08-14 NOTE — Progress Notes (Deleted)
  Cardiology Office Note:  .   Date:  08/14/2023  ID:  Sarah Webster, DOB 1955-09-25, MRN [id number] PCP: Loura Back, NP  [facility] Providers Cardiologist:  Truett Mainland, MD PCP: Loura Back, NP  No chief complaint on file.     History of Present Illness: Marland Kitchen    Sarah Webster is a 68 y.o. female with hypertension, type 2 diabetes mellitus, exertional dyspnea   ***  There were no vitals filed for this visit.   ROS: *** ROS   Studies Reviewed: .        *** Independently interpreted ***/202***: Chol ***, TG ***, HDL ***, LDL *** HbA1C ***% Hb *** Cr *** ***  Risk Assessment/Calculations:   {Does this patient have ATRIAL FIBRILLATION?:[phone]}     Physical Exam:   Physical Exam   VISIT DIAGNOSES: No diagnosis found.   ASSESSMENT AND PLAN: .    Sarah Webster is a 68 y.o. female with hypertension, type 2 diabetes mellitus, exertional dyspnea   *** Exertional dyspnea: Physical exam unremarkable.  Will check echocardiogram.  Low suspicion for obstructive CAD.  Given her diabetes, risk factors, and absence of bleeding, reasonable to continue aspirin 81 mg daily, along with statin.   *** Reduced blood pressure right arm: Monophasic flow noted on ABI with blood pressures in right arm than left arm.  Today, her blood pressures in both arms are not significantly different.  Radial pulses slightly weaker in right arm.  However, she does not have any symptoms of arm claudication, or symptoms to suggest subclavian artery steal syndrome.  I do not hear any obvious murmur in right subclavian area.  Therefore, I do not think she needs any further workup for this at this time.   Further recommendations after above testing.   Thank you for referring the patient to [place]. Please feel free to contact with any questions.  {Are you ordering a CV Procedure (e.g. stress test, cath, DCCV, TEE, etc)?   Press F2        :[id number]}    No orders of the defined types  were placed in this encounter.    F/u in ***  Signed, Elder Negus, MD

## 2024-04-04 ENCOUNTER — Other Ambulatory Visit (HOSPITAL_COMMUNITY): Payer: Medicare Other
